# Patient Record
Sex: Male | Born: 2005 | Race: White | Hispanic: No | Marital: Single
Health system: Southern US, Community
[De-identification: ages and names within clinical notes are randomized; demographics above are authoritative.]

## PROBLEM LIST (undated history)

## (undated) DIAGNOSIS — E669 Obesity, unspecified: Secondary | ICD-10-CM

## (undated) DIAGNOSIS — J302 Other seasonal allergic rhinitis: Secondary | ICD-10-CM

## (undated) DIAGNOSIS — E66811 Obesity, class 1: Secondary | ICD-10-CM

## (undated) HISTORY — DX: Other seasonal allergic rhinitis: J30.2

## (undated) HISTORY — PX: MANDIBLE SURGERY: SHX707

## (undated) HISTORY — DX: Obesity, class 1: E66.811

## (undated) HISTORY — DX: Obesity, unspecified: E66.9

---
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
## 2023-02-03 ENCOUNTER — Ambulatory Visit (INDEPENDENT_AMBULATORY_CARE_PROVIDER_SITE_OTHER): Payer: Medicaid Other | Admitting: Licensed Clinical Social Worker

## 2023-02-03 DIAGNOSIS — F331 Major depressive disorder, recurrent, moderate: Secondary | ICD-10-CM

## 2023-02-03 NOTE — Progress Notes (Addendum)
 o  THERAPIST PROGRESS NOTE  Session Time: 1:00 pm-1:45 pm  Type of Therapy: Individual Therapy  Purpose of session: Joseph [name] will manage mood and anxiety as evidenced by returning to a previous level of social functioning, increase zest for life, challenge depressive and anxious thinking, improve sleep and express emotions appropriately for 5 out of 7 days for 60 days.  ProgressTowards Goals: Progressing  Interventions: Therapist utilized CBT and Solution focused brief therapy to address mood and anxiety. Therapist provided support and empathy to patient during session. Therapist administered a PHQ9 and GAD7 to patient during session.  Therapist worked with patient on challenging depressive thoughts related to not achieving a personal goal.     Effectiveness:  Patient was oriented x4 (person, place, situation, and time). Patient was casually dressed, and appropriately groomed. Patient was alert, engaged, pleasant, and cooperative. Patient completed a PHQ9 and GAD7. Patient enjoyed the holidays and his time off of school. Patient was able to purchase a PC for himself from working. Patient was sick and then started work around Thanksgiving. He quickly realized that it was too much for him and he was overhwhelmed. Patient quit to avoid impacting his physical and mental health. Patient has been beating himself up due to feeling like he failed. Patient understood that he was able to achieve one of his goals of buying a computer though working. He didn't want to sacrifice his main goal of completing school to achieve another one. Patient is planning on trying to work again for the same company in spring or summer so that he can get used to it before the next holiday season.    Patient engaged in session. He responded well to interventions. Patient continues to meet criteria for Major depressive disorder, recurrent episode, moderate with anxious distress. Patient will continue in outpatient therapy due to  being the least restrictive service to meet his needs. Patient made moderate progress on his goals.     12/06/2022    2:07 PM 11/23/2022    1:10 PM 11/11/2022    2:06 PM  Depression screen PHQ 2/9  Decreased Interest 2 2 2   Down, Depressed, Hopeless 2 2 2   PHQ - 2 Score 4 4 4   Altered sleeping 2 1 2   Tired, decreased energy 2 2 2   Change in appetite 0 0 0  Feeling bad or failure about yourself  1 1 1   Trouble concentrating 2 2 1   Moving slowly or fidgety/restless 0 0 0  Suicidal thoughts 1 1 2   PHQ-9 Score 12 11 12        12/06/2022    2:08 PM 11/23/2022    1:10 PM 11/11/2022    2:06 PM  GAD 7 : Generalized Anxiety Score  Nervous, Anxious, on Edge 2 1 1   Control/stop worrying 1 1 0  Worry too much - different things 2 1 1   Trouble relaxing 2 2 2   Restless 2 1 1   Easily annoyed or irritable 2 2 2   Afraid - awful might happen 0 0 0  Total GAD 7 Score 11 8 7   Anxiety Difficulty Somewhat difficult Somewhat difficult Somewhat difficult     Suicidal/Homicidal: Nowithout intent/plan  Plan: Return again in 2-4 weeks.   Diagnosis: Major depressive disorder, recurrent episode, moderate with anxious distress (HCC)  Collaboration of Care: Other sources will be identified.   Patient/Guardian was advised Release of Information must be obtained prior to any record release in order to collaborate their care with an outside provider. Patient/Guardian was  advised if they have not already done so to contact the registration department to sign all necessary forms in order for us  to release information regarding their care.   Consent: Patient/Guardian gives verbal consent for treatment and assignment of benefits for services provided during this visit. Patient/Guardian expressed understanding and agreed to proceed.   Fonda Conroy, LCSW 02/03/2023

## 2023-02-15 ENCOUNTER — Ambulatory Visit (INDEPENDENT_AMBULATORY_CARE_PROVIDER_SITE_OTHER): Payer: Medicaid Other | Admitting: Licensed Clinical Social Worker

## 2023-02-15 DIAGNOSIS — F331 Major depressive disorder, recurrent, moderate: Secondary | ICD-10-CM

## 2023-02-16 NOTE — Progress Notes (Signed)
Virtual Visit via Video Note  I connected with Joseph Joseph [name] on 02/16/23 at  1:00 PM EST by a video enabled telemedicine application and verified that I am speaking with the correct person using two identifiers.  Location: Joseph Joseph [name]: Home Provider: Office   I discussed the limitations of evaluation and management by telemedicine and the availability of in person appointments. The Joseph Joseph [name] expressed understanding and agreed to proceed.    I discussed the assessment and treatment plan with the Joseph Joseph [name]. The Joseph Joseph [name] was provided an opportunity to ask questions and all were answered. The Joseph Joseph [name] agreed with the plan and demonstrated an understanding of the instructions.   The Joseph Joseph [name] was advised to call back or seek an in-person evaluation if the symptoms worsen or if the condition fails to improve as anticipated.  I provided 39 minutes of non-face-to-face time during this encounter.   Bynum Bellows, LCSW   THERAPIST PROGRESS NOTE  Session Time: 1:00 pm-1:39 pm  Type of Therapy: Individual Therapy  Purpose of session: Joseph Joseph [name] manage mood and anxiety as evidenced by returning to a previous level of social functioning, increase zest for life, challenge depressive and anxious thinking, improve sleep and express emotions appropriately for 5 out of 7 days for 60 days.  ProgressTowards Goals: Progressing  Interventions: Therapist utilized CBT and Solution focused brief therapy to address mood and anxiety. Therapist provided support and empathy to Joseph Joseph [name] during session. Therapist processed Joseph Joseph [name]'s feelings to identify triggers for mood. Therapist worked with Joseph Joseph [name] to identify thoughts and feelings related to his identify and returning to a previous level of functioning.    Effectiveness:  Joseph Joseph [name] was oriented x4 (person, place, situation, and time). Joseph Joseph [name] was casually dressed, and appropriately groomed. Joseph Joseph [name] was alert, engaged, pleasant, and cooperative. Joseph Joseph [name] noted that things have  been up and down. Joseph Joseph [name] noted that he Joseph [name] be starting work soon. He noted that he has had some passing thoughts and some thoughts that have stuck with him that impact his mood. Joseph Joseph [name] has thought about his identify and feels that he is transgender. He has been changing his voice online to present feminine. He is not sure how he wants to present or transition. Joseph Joseph [name] is worried about coming out to his parents. He feels like his mother would be accepting but his parents have made "off handed comments" about LGBTQ community. Joseph Joseph [name] is focusing on finishing school and then starting to work. Joseph Joseph [name] is going to look into taking hormones, etc at a later date but isn't sure what insurance would cover. Joseph Joseph [name] is going to take small steps to transition.    Joseph Joseph [name] engaged in session. He responded well to interventions. Joseph Joseph [name] continues to meet criteria for Major depressive disorder, recurrent episode, moderate with anxious distress. Joseph Joseph [name] Joseph [name] continue in outpatient therapy due to being the least restrictive service to meet his needs. Joseph Joseph [name] made moderate progress on his goals.     02/03/2023    1:15 PM 12/06/2022    2:07 PM 11/23/2022    1:10 PM  Depression screen PHQ 2/9  Decreased Interest 2 2 2   Down, Depressed, Hopeless 2 2 2   PHQ - 2 Score 4 4 4   Altered sleeping 2 2 1   Tired, decreased energy 2 2 2   Change in appetite 0 0 0  Feeling bad or failure about yourself  2 1 1   Trouble concentrating 1 2 2   Moving slowly or fidgety/restless 0 0 0  Suicidal thoughts 1 1 1   PHQ-9 Score 12 12 11  02/03/2023    1:15 PM 12/06/2022    2:08 PM 11/23/2022    1:10 PM 11/11/2022    2:06 PM  GAD 7 : Generalized Anxiety Score  Nervous, Anxious, on Edge 2 2 1 1   Control/stop worrying 1 1 1  0  Worry too much - different things 2 2 1 1   Trouble relaxing 2 2 2 2   Restless 1 2 1 1   Easily annoyed or irritable 2 2 2 2   Afraid - awful might happen 1 0 0 0  Total GAD 7 Score 11 11 8 7   Anxiety  Difficulty Somewhat difficult Somewhat difficult Somewhat difficult Somewhat difficult     Suicidal/Homicidal: Nowithout intent/plan  Plan: Return again in 2-4 weeks.   Diagnosis: Major depressive disorder, recurrent episode, moderate with anxious distress (HCC)  Collaboration of Care: Other sources Joseph [name] be identified.   Joseph Joseph [name]/Guardian was advised Release of Information must be obtained prior to any record release in order to collaborate their care with an outside provider. Joseph Joseph [name]/Guardian was advised if they have not already done so to contact the registration department to sign all necessary forms in order for [place] to release information regarding their care.   Consent: Joseph Joseph [name]/Guardian gives verbal consent for treatment and assignment of benefits for services provided during this visit. Joseph Joseph [name]/Guardian expressed understanding and agreed to proceed.   Bynum Bellows, LCSW 02/16/2023

## 2023-02-28 ENCOUNTER — Ambulatory Visit (INDEPENDENT_AMBULATORY_CARE_PROVIDER_SITE_OTHER): Payer: Medicaid Other | Admitting: Licensed Clinical Social Worker

## 2023-02-28 DIAGNOSIS — F331 Major depressive disorder, recurrent, moderate: Secondary | ICD-10-CM

## 2023-02-28 NOTE — Progress Notes (Unsigned)
Virtual Visit via Video Note  I connected with Nizar Decuir on 02/28/23 at  1:00 PM EST by a video enabled telemedicine application and verified that I am speaking with the correct person using two identifiers.  Location: Patient: Home Provider: Office   I discussed the limitations of evaluation and management by telemedicine and the availability of in person appointments. The patient expressed understanding and agreed to proceed.    I discussed the assessment and treatment plan with the patient. The patient was provided an opportunity to ask questions and all were answered. The patient agreed with the plan and demonstrated an understanding of the instructions.   The patient was advised to call back or seek an in-person evaluation if the symptoms worsen or if the condition fails to improve as anticipated.  I provided 39 minutes of non-face-to-face time during this encounter.   Bynum Bellows, LCSW   THERAPIST PROGRESS NOTE  Session Time: 1:00 pm-1:39 pm  Type of Therapy: Individual Therapy  Purpose of session: Zakye will manage mood and anxiety as evidenced by returning to a previous level of social functioning, increase zest for life, challenge depressive and anxious thinking, improve sleep and express emotions appropriately for 5 out of 7 days for 60 days.  ProgressTowards Goals: Progressing  Interventions: Therapist utilized CBT and Solution focused brief therapy to address mood and anxiety. Therapist provided support and empathy to patient during session. Therapist administered PHQ9 and GAD7 to patient.    Effectiveness:  Patient was oriented x4 (person, place, situation, and time). Patient was casually dressed, and appropriately groomed. Patient was alert, engaged, pleasant, and cooperative.   Patient engaged in session. He responded well to interventions. Patient continues to meet criteria for Major depressive disorder, recurrent episode, moderate with anxious distress. Patient  will continue in outpatient therapy due to being the least restrictive service to meet his needs. Patient made moderate progress on his goals.     02/28/2023    1:07 PM 02/03/2023    1:15 PM 12/06/2022    2:07 PM  Depression screen PHQ 2/9  Decreased Interest 2 2 2   Down, Depressed, Hopeless 2 2 2   PHQ - 2 Score 4 4 4   Altered sleeping 2 2 2   Tired, decreased energy 2 2 2   Change in appetite 1 0 0  Feeling bad or failure about yourself  2 2 1   Trouble concentrating 1 1 2   Moving slowly or fidgety/restless 0 0 0  Suicidal thoughts 1 1 1   PHQ-9 Score 13 12 12        02/28/2023    1:08 PM 02/03/2023    1:15 PM 12/06/2022    2:08 PM 11/23/2022    1:10 PM  GAD 7 : Generalized Anxiety Score  Nervous, Anxious, on Edge 2 2 2 1   Control/stop worrying 2 1 1 1   Worry too much - different things 2 2 2 1   Trouble relaxing 2 2 2 2   Restless 1 1 2 1   Easily annoyed or irritable 0 2 2 2   Afraid - awful might happen 1 1 0 0  Total GAD 7 Score 10 11 11 8   Anxiety Difficulty Somewhat difficult Somewhat difficult Somewhat difficult Somewhat difficult     Suicidal/Homicidal: Nowithout intent/plan  Plan: Return again in 2-4 weeks.   Diagnosis: Major depressive disorder, recurrent episode, moderate with anxious distress (HCC)  Collaboration of Care: Other sources will be identified.   Patient/Guardian was advised Release of Information must be obtained prior to any record  release in order to collaborate their care with an outside provider. Patient/Guardian was advised if they have not already done so to contact the registration department to sign all necessary forms in order for [place] to release information regarding their care.   Consent: Patient/Guardian gives verbal consent for treatment and assignment of benefits for services provided during this visit. Patient/Guardian expressed understanding and agreed to proceed.   Bynum Bellows, LCSW 02/28/2023

## 2023-03-14 ENCOUNTER — Ambulatory Visit (INDEPENDENT_AMBULATORY_CARE_PROVIDER_SITE_OTHER): Payer: Medicaid Other | Admitting: Licensed Clinical Social Worker

## 2023-03-14 DIAGNOSIS — F331 Major depressive disorder, recurrent, moderate: Secondary | ICD-10-CM

## 2023-03-14 NOTE — Progress Notes (Signed)
THERAPIST PROGRESS NOTE  Session Time: 1:05 pm-1:45 pm  Type of Therapy: Individual Therapy  Purpose of session: Joseph [name] will manage mood and anxiety as evidenced by returning to a previous level of social functioning, increase zest for life, challenge depressive and anxious thinking, improve sleep and express emotions appropriately for 5 out of 7 days for 60 days.  ProgressTowards Goals: Progressing  Interventions: Therapist utilized CBT and Solution focused brief therapy to address mood and anxiety. Therapist provided support and empathy to patient during session. Therapist administered PHQ9 and GAD7 to patient. Therapist processed patient's feelings to identify triggers for mood. Therapist assessed patient's passive SI. Therapist worked with patient on returning to a previous level of functioning and gender identity.   Effectiveness:  Patient was oriented x4 (person, place, situation, and time). Patient was casually dressed, and appropriately groomed. Patient was alert, engaged, pleasant, and cooperative. Patient completed the PHQ9 and GAD7.  Patient noted that things have been good. He feels like not a lot of "major" things have occurred. Patient has had passive SI. He denies plans or means. Patient has no desire to die but has pondered what it would be like if he were not around. He wonders if anyone would grieve him. Patient noted this is his brain being mean to him. He does not give into those thoughts and has parents as well as friends that he speaks to if he is feeling down.   Patient has been looking into taking HRT and the process of transitioning. Patient feels good thinking about taking HRT eventually. Patient feels like he would be more comfortable with another gender. Patient is still researching it. Patient also asked to get connected to a psychiatrist.    Patient engaged in session. He responded well to interventions. Patient continues to meet criteria for Major depressive disorder,  recurrent episode, moderate with anxious distress. Patient will continue in outpatient therapy due to being the least restrictive service to meet his needs. Patient made moderate progress on his goals.     03/14/2023    1:07 PM 02/28/2023    1:07 PM 02/03/2023    1:15 PM  Depression screen PHQ 2/9  Decreased Interest 2 2 2   Down, Depressed, Hopeless 1 2 2   PHQ - 2 Score 3 4 4   Altered sleeping 2 2 2   Tired, decreased energy 1 2 2   Change in appetite 1 1 0  Feeling bad or failure about yourself  2 2 2   Trouble concentrating 2 1 1   Moving slowly or fidgety/restless 1 0 0  Suicidal thoughts 2 1 1   PHQ-9 Score 14 13 12        03/14/2023    1:08 PM 02/28/2023    1:08 PM 02/03/2023    1:15 PM 12/06/2022    2:08 PM  GAD 7 : Generalized Anxiety Score  Nervous, Anxious, on Edge 1 2 2 2   Control/stop worrying 1 2 1 1   Worry too much - different things 1 2 2 2   Trouble relaxing 2 2 2 2   Restless 2 1 1 2   Easily annoyed or irritable 1 0 2 2  Afraid - awful might happen 1 1 1  0  Total GAD 7 Score 9 10 11 11   Anxiety Difficulty Somewhat difficult Somewhat difficult Somewhat difficult Somewhat difficult     Suicidal/Homicidal: Nowithout intent/plan  Plan: Return again in 2-4 weeks.   Diagnosis: Major depressive disorder, recurrent episode, moderate with anxious distress (HCC)  Collaboration of Care: Other sources will be  identified.   Patient/Guardian was advised Release of Information must be obtained prior to any record release in order to collaborate their care with an outside provider. Patient/Guardian was advised if they have not already done so to contact the registration department to sign all necessary forms in order for [place] to release information regarding their care.   Consent: Patient/Guardian gives verbal consent for treatment and assignment of benefits for services provided during this visit. Patient/Guardian expressed understanding and agreed to proceed.   Bynum Bellows,  LCSW 03/14/2023

## 2023-03-17 ENCOUNTER — Encounter (HOSPITAL_COMMUNITY): Payer: Self-pay | Admitting: Psychiatry

## 2023-03-17 ENCOUNTER — Ambulatory Visit (HOSPITAL_COMMUNITY): Payer: Medicaid Other | Admitting: Psychiatry

## 2023-03-17 DIAGNOSIS — F411 Generalized anxiety disorder: Secondary | ICD-10-CM

## 2023-03-17 DIAGNOSIS — F4010 Social phobia, unspecified: Secondary | ICD-10-CM | POA: Diagnosis not present

## 2023-03-17 DIAGNOSIS — F331 Major depressive disorder, recurrent, moderate: Secondary | ICD-10-CM | POA: Diagnosis not present

## 2023-03-17 MED ORDER — CITALOPRAM HYDROBROMIDE 10 MG PO TABS: 10.0000 mg | ORAL_TABLET | Freq: Every day | ORAL | 0 refills | Status: DC

## 2023-03-17 NOTE — Progress Notes (Signed)
Psychiatric Initial Adult Assessment   Patient Identification: Joseph [name] MRN:  [id number] Date of Evaluation:  03/17/2023 Referral Source: Therapist Chief Complaint:   Chief Complaint  Patient presents with   Establish Care   Depression   Visit Diagnosis:    ICD-10-CM   1. Major depressive disorder, recurrent episode, moderate with anxious distress (HCC)  F33.1     2. GAD (generalized anxiety disorder)  F41.1     3. Social anxiety disorder  F40.10      Virtual Visit via Video Note  I connected with Qusay Radford on 03/17/23 at 11:00 AM EST by a video enabled telemedicine application and verified that I am speaking with the correct person using two identifiers.  Location: Patient: home Provider: home office    I discussed the limitations of evaluation and management by telemedicine and the availability of in person appointments. The patient expressed understanding and agreed to proceed.     I discussed the assessment and treatment plan with the patient. The patient was provided an opportunity to ask questions and all were answered. The patient agreed with the plan and demonstrated an understanding of the instructions.   The patient was advised to call back or seek an in-person evaluation if the symptoms worsen or if the condition fails to improve as anticipated.  I provided 60 minutes of non-face-to-face time during this encounter.   History of Present Illness: Patient is a single 18 years old white male lives with his parents plans to complete his GED this year referred by her therapist to establish care with history of depression.    Patient presents with a history of depression episode that has started a couple of years ago his depression is described with sadness withdrawn isolation decreased energy disturbed sleep and feeling of hopelessness and passive suicidal ideation with no attempt in the past.  Once started therapy he feels his depression is somewhat better but  still present  There is no associated psychotic symptoms with that depression  There is some episode that can last for 1 or 2 days of elevated mood that he is more social and that he is somewhat more talkative otherwise he is described himself to be more withdrawn and has social anxiety worries about criticism or of meeting new people  He also describes anxiety, excessive worries he worries about his future about his goals he feels his anxiety makes him worry and how to handle or approach his stressors  He has a lot of answers described as I do not know and then he feels that he wants to explore what what he is or should be diagnosed with.  On collaboration he does understand and agreed that it is pretty clear that his depression needs to be treated as he has had some passive suicidal thoughts and no plan and is willing to start on medication.  He is close to his mom but he is not still opening up about things which does not bother him. He is stressed out about transitioning he wants to transition to male but has not opened up with family members or anybody else but accept for his close friend.  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  He does feel shy withdrawn and also endorses social anxiety more so he is good with his couple of good friends  Describes depression to be ongoing with feeling of hopelessness disturbed sleep and appetite  Aggravating factors; difficult schooling , how to tell of transitioning to family when ready  Modifying factors: video games, music , some friends  Duration more then 3 years  Hospital admission: denies  Suicide attempt: denies   Associated Signs/Symptoms: Depression Symptoms:   depressed mood, psychomotor retardation, fatigue, difficulty concentrating, anxiety, disturbed sleep, weight loss, (Hypo) Manic Symptoms:  Distractibility, Anxiety Symptoms:  Excessive Worry, Social Anxiety,  Past Psychiatric History: depression  Previous Psychotropic Medications: Yes  Prozac didn't help Substance Abuse History in the last 12 months:  No.  Consequences of Substance Abuse: NA  Past Medical History:  Past Medical History:  Diagnosis Date   Class 1 obesity    Seasonal allergies     Past Surgical History:  Procedure Laterality Date   MANDIBLE SURGERY      Family Psychiatric History: mom; bipolar, brother Maricela Curet: bipolar  Family History:  Family History  Problem Relation Age of Onset   Rheum arthritis Father    Heart attack Father    Heart disease Father    Diabetes Maternal Grandmother     Social History:   Social History   Socioeconomic History   Marital status: Single    Spouse name: Not on file   Number of children: Not on file   Years of education: Not on file   Highest education level: Not on file  Occupational History   Not on file  Tobacco Use   Smoking status: Passive Smoke Exposure - Never Smoker   Smokeless tobacco: Never  Substance and Sexual Activity   Alcohol use: No   Drug use: No   Sexual activity: Not on file  Other Topics Concern   Not on file  Social History Narrative   Not on file   Social Drivers of Health   Financial Resource Strain: Low Risk  (02/10/2023)   Received from [facility]   Overall Financial Resource Strain (CARDIA)    Difficulty of Paying Living Expenses: Not very hard  Food Insecurity: No Food Insecurity (02/10/2023)   Received [facility]   Hunger Vital Sign    Worried About Running Out of Food in the Last Year: Never true    Ran Out of Food in the Last Year: Never true  Transportation Needs: No Transportation Needs (02/10/2023)   Received from [facility] Transportation     Lack of Transportation (Medical): No    Lack of Transportation (Non-Medical): No  Physical Activity: Unknown (02/10/2023)   [facility]   Exercise Vital Sign    Days of Exercise per Week: 0 days    Minutes of Exercise per Session: Not on file  Stress: Stress Concern Present (02/10/2023)   Received from [facility] - Occupational Stress Questionnaire    Feeling of Stress : To some extent  Social Connections: Somewhat Isolated (02/10/2023)   Received from [facility]   Social Network    How would you rate your social network (family, work, friends)?: Restricted participation with some degree of social isolation    Additional Social History: grew up with parents, denies abuse but some challenges denies abuse Difficult elementary schooling and bullying then did online schooling high school  Allergies:  No Known Allergies  Metabolic Disorder  Labs: No results found for: "HGBA1C", "MPG" No results found for: "PROLACTIN" No results found for: "CHOL", "TRIG", "HDL", "CHOLHDL", "VLDL", "LDLCALC" No results found for: "TSH"  Therapeutic Level Labs: No results found for: "LITHIUM" No results found for: "CBMZ" No results found for: "VALPROATE"  Current Medications: Current Outpatient Medications  Medication Sig Dispense Refill   citalopram (CELEXA) 10 MG tablet Take 1 tablet (10 mg total) by mouth daily. 30 tablet 0   No current facility-administered medications for this visit.    Psychiatric Specialty Exam: Review of Systems  There were no vitals taken for this visit.There is no height or weight on file to calculate BMI.  General Appearance: Casual  Eye Contact:  Fair  Speech:  Clear and Coherent  Volume:  Decreased  Mood:  Depressed  Affect:  Congruent  Thought Process:  Goal Directed  Orientation:  Full (Time, Place, and Person)  Thought Content:  Rumination  Suicidal Thoughts:  No  Homicidal Thoughts:  No   Memory:  Immediate;   Fair  Judgement:  Fair  Insight:  Fair  Psychomotor Activity:  Decreased  Concentration:  Concentration: Fair  Recall:  [employer] of Knowledge:Good  Language: Good  Akathisia:  No  Handed:    AIMS (if indicated):  not done  Assets:  Desire for Improvement  ADL's:  Intact  Cognition: WNL  Sleep:   irregular   Screenings: GAD-7    Advertising copywriter from 03/14/2023 in [facility] at [facility] Counselor from 02/28/2023 in [facility] Outpatient Behavioral Health at [facility] Counselor from 02/03/2023 in [facility] Outpatient Behavior[facility] at [facility] Counselor from 12/06/2022 in [facility] Outpatient Behavior[facility] Health at [facility] Counselor from 11/23/2022 in [facility] Outpatient Behavioral [facility] at [facility]  Total GAD-7 Score 9 10 11 11 8       PHQ2-9    Flowsheet Row Office Visit from 03/17/2023 in [facility] at [facility] Counselor from 03/14/2023 in [facility] Outpatient Behavioral Health at [facility] Counselor from 02/28/2023 in [facility] at [facility] Counselor from 02/03/2023 in [facility] Health at [facility] Counselor from 12/06/2022 in [facility] Outpatient Behavioral Health at [facility]  PHQ-2 Total Score 3 3 4 4 4   PHQ-9 Total Score 10 14 13 12 12       Flowsheet Row Counselor from 06/25/2021 in [facility] Outpatient Behavioral Health at [facility]  C-SSRS RISK CATEGORY Error: Q7 should not be populated when Q6 is No       Assessment and Plan: as folllows   Major depressive disorder recurrent moderate; he is willing to start medication considering his depressive symptoms are still present therapy has helped somewhat.  Start citalopram 10 mg discussed risk and side effects we will monitor and  follow-up in 3 to 4 weeks  Discussed suicidality in detail in case has any passive harmful thoughts, that he can call understands he can call suicide hotline, urgent care visit ER and also call our office . Work on distraction and add activities to look forward . Is having challenges how to move forward with transitioning. Continue therapy to explore and help in processing  He is aware of how to get help in case he is having suicidal thoughts currently he is not suicidal never attempted does not have any plan  Generalized anxiety disorder; start Celexa 10 mg as above continue in therapy to explore his anxiety  worries and process them work on distraction from negative thoughts  Social anxiety disorder; continue working in therapy and start citalopram 10 mg as above  Patient to monitor any changes mood symptoms mood swings or and increase in elevated mood  Call us earlier otherwise follow-up in 3 to 4 weeks Collaboration of Care: Other notes and chart, reviewed of therapist   Patient/Guardian was advised Release of Information must be obtained prior to any record release in order to collaborate their care with an outside provider. Patient/Guardian was advised if they have not already done so to contact the registration department to sign all necessary forms in order for [place] to release information regarding their care.   Consent: Patient/Guardian gives verbal consent for treatment and assignment of benefits for services provided during this visit. Patient/Guardian expressed understanding and agreed to proceed.   Thresa Ross, MD 2/20/202511:18 AM

## 2023-03-30 ENCOUNTER — Ambulatory Visit (INDEPENDENT_AMBULATORY_CARE_PROVIDER_SITE_OTHER): Payer: Medicaid Other | Admitting: Licensed Clinical Social Worker

## 2023-03-30 DIAGNOSIS — F331 Major depressive disorder, recurrent, moderate: Secondary | ICD-10-CM | POA: Diagnosis not present

## 2023-03-30 DIAGNOSIS — F411 Generalized anxiety disorder: Secondary | ICD-10-CM

## 2023-03-30 NOTE — Progress Notes (Unsigned)
 THERAPIST PROGRESS NOTE  Session Time: 1:03 pm-1:50 pm  Type of Therapy: Individual Therapy  Purpose of session: Theon will manage mood and anxiety as evidenced by returning to a previous level of social functioning, increase zest for life, challenge depressive and anxious thinking, improve sleep and express emotions appropriately for 5 out of 7 days for 60 days.  ProgressTowards Goals: Progressing  Interventions: Therapist utilized CBT and Solution focused brief therapy to address mood and anxiety. Therapist provided support and empathy to patient during session. Therapist administered PHQ9 and GAD7 to patient. Therapist worked with patient on increasing zest for life.   Effectiveness:  Patient was oriented x4 (person, place, situation, and time). Patient was casually dressed, and appropriately groomed. Patient was alert, engaged, pleasant, and cooperative. Patient completed the PHQ9 and GAD7.  Patient noted he has experienced ups and downs. Patient will be starting his GED program tomorrow. He is going in for testing. Patient also spoke with [employer] through [facility] to work on his physical health. Patient is taking steps to improve himself. Patient has been studying and researching coming out as transgender. He wants to get a job so before he comes out to his family so his family doesn't feel like they have to help him with HRT or anything. Patient just wants support from his parents but not finacial support when it comes to transition.    Patient engaged in session. He responded well to interventions. Patient continues to meet criteria for Major depressive disorder, recurrent episode, moderate with anxious distress. Patient will continue in outpatient therapy due to being the least restrictive service to meet his needs. Patient made moderate progress on his goals.     03/30/2023    1:07 PM 03/17/2023   11:14 AM 03/14/2023    1:07 PM  Depression screen PHQ 2/9  Decreased Interest 1 2 2    Down, Depressed, Hopeless 2 1 1   PHQ - 2 Score 3 3 3   Altered sleeping 2 2 2   Tired, decreased energy 2 1 1   Change in appetite 1 1 1   Feeling bad or failure about yourself  2 2 2   Trouble concentrating 1 1 2   Moving slowly or fidgety/restless 0 0 1  Suicidal thoughts 1  2  PHQ-9 Score 12 10 14   Difficult doing work/chores  Somewhat difficult        03/30/2023    1:07 PM 03/14/2023    1:08 PM 02/28/2023    1:08 PM 02/03/2023    1:15 PM  GAD 7 : Generalized Anxiety Score  Nervous, Anxious, on Edge 2 1 2 2   Control/stop worrying 2 1 2 1   Worry too much - different things 1 1 2 2   Trouble relaxing 1 2 2 2   Restless 2 2 1 1   Easily annoyed or irritable 1 1 0 2  Afraid - awful might happen 1 1 1 1   Total GAD 7 Score 10 9 10 11   Anxiety Difficulty Somewhat difficult Somewhat difficult Somewhat difficult Somewhat difficult     Suicidal/Homicidal: Nowithout intent/plan  Plan: Return again in 2-4 weeks.   Diagnosis: Major depressive disorder, recurrent episode, moderate with anxious distress (HCC)  GAD (generalized anxiety disorder)  Collaboration of Care: Other sources will be identified.   Patient/Guardian was advised Release of Information must be obtained prior to any record release in order to collaborate their care with an outside provider. Patient/Guardian was advised if they have not already done so to contact the registration department to sign  all necessary forms in order for [place] to release information regarding their care.   Consent: Patient/Guardian gives verbal consent for treatment and assignment of benefits for services provided during this visit. Patient/Guardian expressed understanding and agreed to proceed.   Bynum Bellows, LCSW 03/30/2023

## 2023-04-13 ENCOUNTER — Ambulatory Visit (INDEPENDENT_AMBULATORY_CARE_PROVIDER_SITE_OTHER): Payer: Medicaid Other | Admitting: Licensed Clinical Social Worker

## 2023-04-13 ENCOUNTER — Telehealth (HOSPITAL_COMMUNITY): Payer: Self-pay | Admitting: *Deleted

## 2023-04-13 DIAGNOSIS — F331 Major depressive disorder, recurrent, moderate: Secondary | ICD-10-CM | POA: Diagnosis not present

## 2023-04-13 DIAGNOSIS — F411 Generalized anxiety disorder: Secondary | ICD-10-CM

## 2023-04-13 MED ORDER — CITALOPRAM HYDROBROMIDE 10 MG PO TABS: 10.0000 mg | ORAL_TABLET | Freq: Every day | ORAL | 0 refills | Status: DC

## 2023-04-13 NOTE — Addendum Note (Signed)
 Addended by: Thresa Ross on: 04/13/2023 01:54 PM   Modules accepted: Orders

## 2023-04-13 NOTE — Progress Notes (Unsigned)
 THERAPIST PROGRESS NOTE  Session Time: 1:00 pm-1:45 pm  Type of Therapy: Individual Therapy  Purpose of session: Joseph [name] will manage mood and anxiety as evidenced by returning to a previous level of social functioning, increase zest for life, challenge depressive and anxious thinking, improve sleep and express emotions appropriately for 5 out of 7 days for 60 days.  ProgressTowards Goals: Progressing  Interventions: Therapist utilized CBT and Solution focused brief therapy to address mood and anxiety. Therapist provided support and empathy to patient during session. Therapist administered PHQ9 and GAD7 to patient. Therapist worked with patient to identify barriers to returning to a previous level of functioning.   Effectiveness:  Patient was oriented x4 (person, place, situation, and time). Patient was casually dressed, and appropriately groomed. Patient was alert, engaged, pleasant, and cooperative. Patient completed the PHQ9 and GAD7.  Patient noted that has felt more down. Patient feels like he "forced" his friend to play a game on two occasions that he wasn't interested in. Patient understood that his friend wouldn't have played if he didn't want to connect with him on either occasion. Patient feels like he overanalyzed the interactions. Patient feels like the medication he is on is not working like it should. He can fall asleep faster but is more emotional than he was before taking it. He noted that even his mother noticed a difference in him since he started  his medication. Patient is going to talk to the psychiatrist about this next visit. Patient has not started school. He went to testing and orientation for school but has not heard anything from the school about a start date. Patient is going to have his mother call to follow up.    Patient engaged in session. He responded well to interventions. Patient continues to meet criteria for Major depressive disorder, recurrent episode, moderate  with anxious distress. Patient will continue in outpatient therapy due to being the least restrictive service to meet his needs. Patient made moderate progress on his goals.     04/13/2023    1:02 PM 03/30/2023    1:07 PM 03/17/2023   11:14 AM  Depression screen PHQ 2/9  Decreased Interest 2 1 2   Down, Depressed, Hopeless 2 2 1   PHQ - 2 Score 4 3 3   Altered sleeping 2 2 2   Tired, decreased energy 1 2 1   Change in appetite 0 1 1  Feeling bad or failure about yourself  2 2 2   Trouble concentrating 1 1 1   Moving slowly or fidgety/restless 0 0 0  Suicidal thoughts 1 1   PHQ-9 Score 11 12 10   Difficult doing work/chores   Somewhat difficult       03/30/2023    1:07 PM 03/14/2023    1:08 PM 02/28/2023    1:08 PM 02/03/2023    1:15 PM  GAD 7 : Generalized Anxiety Score  Nervous, Anxious, on Edge 2 1 2 2   Control/stop worrying 2 1 2 1   Worry too much - different things 1 1 2 2   Trouble relaxing 1 2 2 2   Restless 2 2 1 1   Easily annoyed or irritable 1 1 0 2  Afraid - awful might happen 1 1 1 1   Total GAD 7 Score 10 9 10 11   Anxiety Difficulty Somewhat difficult Somewhat difficult Somewhat difficult Somewhat difficult     Suicidal/Homicidal: Nowithout intent/plan  Plan: Return again in 2-4 weeks.   Diagnosis: Major depressive disorder, recurrent episode, moderate with anxious distress (HCC)  GAD (generalized  anxiety disorder)  Collaboration of Care: Other sources will be identified.   Patient/Guardian was advised Release of Information must be obtained prior to any record release in order to collaborate their care with an outside provider. Patient/Guardian was advised if they have not already done so to contact the registration department to sign all necessary forms in order for [place] to release information regarding their care.   Consent: Patient/Guardian gives verbal consent for treatment and assignment of benefits for services provided during this visit. Patient/Guardian expressed  understanding and agreed to proceed.   Bynum Bellows, LCSW 04/13/2023

## 2023-04-13 NOTE — Telephone Encounter (Signed)
 90 DAY PRESCRIPTION REQUEST [id number] - , [place] - [address]   citalopram (CELEXA) 10 MG tablet   NEXT APPT 04/20/23 LAST APPT 03/17/23

## 2023-04-18 ENCOUNTER — Telehealth (HOSPITAL_COMMUNITY): Payer: Self-pay | Admitting: *Deleted

## 2023-04-18 NOTE — Telephone Encounter (Signed)
 90 day Rx Request [id number] - [place], [place] - [address]   citalopram (CELEXA) 10 MG tablet  NEXT APPT 04/20/23 LAST APPT 03/17/23

## 2023-04-18 NOTE — Telephone Encounter (Signed)
 LVM --F/U PER PROVIDER Appointment in 2 days, can patient wait.

## 2023-04-20 ENCOUNTER — Telehealth (HOSPITAL_COMMUNITY): Payer: Medicaid Other | Admitting: Psychiatry

## 2023-04-20 ENCOUNTER — Encounter (HOSPITAL_COMMUNITY): Payer: Self-pay | Admitting: Psychiatry

## 2023-04-20 DIAGNOSIS — F331 Major depressive disorder, recurrent, moderate: Secondary | ICD-10-CM | POA: Diagnosis not present

## 2023-04-20 DIAGNOSIS — F411 Generalized anxiety disorder: Secondary | ICD-10-CM

## 2023-04-20 DIAGNOSIS — F4010 Social phobia, unspecified: Secondary | ICD-10-CM

## 2023-04-20 MED ORDER — VENLAFAXINE HCL ER 37.5 MG PO CP24: 37.5000 mg | ORAL_CAPSULE | Freq: Every day | ORAL | 0 refills | Status: DC

## 2023-04-20 NOTE — Progress Notes (Signed)
 [facility] Follow up visit  Patient Identification: Joseph [name] MRN:  [id number] Date of Evaluation:  04/20/2023 Referral Source: Therapist Chief Complaint:   No chief complaint on file. Follow up mood /depression  Visit Diagnosis:    ICD-10-CM   1. Major depressive disorder, recurrent episode, moderate with anxious distress (HCC)  F33.1     2. GAD (generalized anxiety disorder)  F41.1     3. Social anxiety disorder  F40.10     Virtual Visit via Video Note  I connected with Joseph [name] on 04/20/23 at 10:00 AM EDT by a video enabled telemedicine application and verified that I am speaking with the correct person using two identifiers.  Location: Patient: home Provider: home office   I discussed the limitations of evaluation and management by telemedicine and the availability of in person appointments. The patient expressed understanding and agreed to proceed.     I discussed the assessment and treatment plan with the patient. The patient was provided an opportunity to ask questions and all were answered. The patient agreed with the plan and demonstrated an understanding of the instructions.   The patient was advised to call back or seek an in-person evaluation if the symptoms worsen or if the condition fails to improve as anticipated.  I provided 20 minutes of non-face-to-face time during this encounter.     History of Present Illness: Patient is a single 18 years old white male lives with his parents plans to complete his GED this year initially  referred by her therapist to establish care with history of depression.    Patient diagnosed with depression, anxiety, social anxiety Was having difficult time in with communicating to parents of his transitioning to male is in therapy  Last visit started celexa, he feels he has been more emotional on that and it didn't help depression.  Planning to complete GED   He was bullied when he was in elementary school but then after that  he has been doing online schooling high school was online.  Aggravating factors; difficult schooling , how to tell of transitioning to family when ready  Modifying factors: video games, music , some friends  Duration more then 3 years  Hospital admission: denies  Suicide attempt: denies  Past Psychiatric History: depression  Previous Psychotropic Medications: Yes  Prozac didn't help Substance Abuse History in the last 12 months:  No.  Consequences of Substance Abuse: NA  Past Medical History:  Past Medical History:  Diagnosis Date   Class 1 obesity    Seasonal allergies     Past Surgical History:  Procedure Laterality Date   MANDIBLE SURGERY      Family Psychiatric History: mom; bipolar, brother Maricela Curet: bipolar  Family History:  Family History  Problem Relation Age of Onset   Rheum arthritis Father    Heart attack Father    Heart disease Father    Diabetes Maternal Grandmother     Social History:   Social History   Socioeconomic History   Marital status: Single    Spouse name: Not on file   Number of children: Not on file   Years of education: Not on file   Highest education level: Not on file  Occupational History   Not on file  Tobacco Use   Smoking status: Passive Smoke Exposure - Never Smoker   Smokeless tobacco: Never  Substance and Sexual Activity   Alcohol use: No   Drug use: No   Sexual activity: Not on file  Other Topics  Concern   Not on file  Social History Narrative   Not on file   Social Drivers of Health   Financial Resource Strain: Low Risk  (02/10/2023)   Received from [facility]   Overall Financial Resource Strain (CARDIA)    Difficulty of Paying Living Expenses: Not very hard  Food Insecurity: No Food Insecurity (02/10/2023)   Received from [facility]   Hunger Vital Sign    Worried About Running Out of Food in the Last Year: Never true    Ran Out of Food in the Last Year: Never true  Transportation Needs: No  Transportation Needs (02/10/2023)   Received from [facility] - Transportation    Lack of Transportation (Medical): No    Lack of Transportation (Non-Medical): No  Physical Activity: Unknown (02/10/2023)   Received from [facility]   Exercise Vital Sign    Days of Exercise per Week: 0 days    Minutes of Exercise per Session: Not on file  Stress: Stress Concern Present (02/10/2023)   Received from [facility] - Occupational Stress Questionnaire    Feeling of Stress : To some extent  Social Connections: Somewhat Isolated (02/10/2023)   Received from [facility]   Social Network    How would you rate your social network (family, work, friends)?: Restricted participation with some degree of social isolation     Allergies:  No Known Allergies  Metabolic Disorder Labs: No results found for: "HGBA1C", "MPG" No results found for: "PROLACTIN" No results found for: "CHOL", "TRIG", "HDL", "CHOLHDL", "VLDL", "LDLCALC" No results found for: "TSH"  Therapeutic Level Labs: No results found for: "LITHIUM" No results found for: "CBMZ" No results found for: "VALPROATE"  Current Medications: Current Outpatient Medications  Medication Sig Dispense Refill   venlafaxine XR (EFFEXOR XR) 37.5 MG 24 hr capsule Take 1 capsule (37.5 mg total) by mouth daily with breakfast. 30 capsule 0   No current facility-administered medications for this visit.    Psychiatric Specialty Exam: Review of Systems  There were no vitals taken for this visit.There is no height or weight on file to calculate BMI.  General Appearance: Casual  Eye Contact:  Fair  Speech:  Clear and Coherent  Volume:  Decreased  Mood:  subdued  Affect:  Congruent  Thought Process:  Goal Directed  Orientation:  Full (Time, Place, and Person)  Thought Content:  Rumination  Suicidal Thoughts:  No  Homicidal Thoughts:  No  Memory:  Immediate;   Fair  Judgement:  Fair   Insight:  Fair  Psychomotor Activity:  Decreased  Concentration:  Concentration: Fair  Recall:  Fair  Fund of Knowledge:Good  Language: Good  Akathisia:  No  Handed:    AIMS (if indicated):  not done  Assets:  Desire for Improvement  ADL's:  Intact  Cognition: WNL  Sleep:   irregular   Screenings: GAD-7    Flowsheet Row Counselor from 04/13/2023 in [facility] Out[facility] Behavior[facility] at [facility] Counselor from 03/30/2023 in [facility] Outpatient Behavioral Health at [facility] Counselor from 03/14/2023 in [facility] Outpatient Behavioral Health at [facility] Counselor from 02/28/2023 in [facility] Outpatient Behavioral Health at [facility] Counselor from 02/03/2023 in [facility] Health Outpatient Behavioral Health at [facility]  Total GAD-7 Score 8 10 9 10 11       PHQ2-9    Flowsheet Row Counselor from 04/13/2023 in [facility] Outpatient Behavioral Health at [facility]  Counselor from 03/30/2023 in [facility] Outpatient Behavioral Health at [facility] Office Visit from 03/17/2023 in [facility] Outpatient Behavioral Health at [facility] Counselor from 03/14/2023 in [facility] Outpatient Behavioral Health at [facility] Counselor from 02/28/2023 in [facility] Outpatient Behavior[facility] Health at [facility]  PHQ-2 Total Score 4 3 3 3 4   PHQ-9 Total Score 11 12 10 14 13       Flowsheet Row Counselor from 06/25/2021 in [facility] Outpatient Behavioral Health at [facility]  C-SSRS RISK CATEGORY Error: Q7 should not be populated when Q6 is No       Assessment and Plan: as folllows  Prior documentation reviewed   Major depressive disorder recurrent moderate; subdued, will stop celexa, start effexor small dose continue therapy    Generalized anxiety disorder; start effexor   Social anxiety disorder; continue therapy and start effexor  Add ME time   Patient to  continue monitor any changes mood symptoms mood swings or and increase in elevated mood  Call us earlier otherwise follow-up in 4 weeks  Collaboration of Care: Other notes and chart, reviewed of therapist   Patient/Guardian was advised Release of Information must be obtained prior to any record release in order to collaborate their care with an outside provider. Patient/Guardian was advised if they have not already done so to contact the registration department to sign all necessary forms in order for [place] to release information regarding their care.   Consent: Patient/Guardian gives verbal consent for treatment and assignment of benefits for services provided during this visit. Patient/Guardian expressed understanding and agreed to proceed.   Thresa Ross, MD 3/26/202510:08 AM

## 2023-04-26 ENCOUNTER — Ambulatory Visit (INDEPENDENT_AMBULATORY_CARE_PROVIDER_SITE_OTHER): Payer: Medicaid Other | Admitting: Licensed Clinical Social Worker

## 2023-04-26 DIAGNOSIS — F331 Major depressive disorder, recurrent, moderate: Secondary | ICD-10-CM | POA: Diagnosis not present

## 2023-04-26 DIAGNOSIS — F411 Generalized anxiety disorder: Secondary | ICD-10-CM

## 2023-04-26 NOTE — Progress Notes (Signed)
 THERAPIST PROGRESS NOTE  Session Time: 1:00 pm-1:45 pm  Type of Therapy: Individual Therapy  Purpose of session: Rohin will manage mood and anxiety as evidenced by returning to a previous level of social functioning, increase zest for life, challenge depressive and anxious thinking, improve sleep and express emotions appropriately for 5 out of 7 days for 60 days.  ProgressTowards Goals: Progressing  Interventions: Therapist utilized CBT and Solution focused brief therapy to address mood and anxiety. Therapist provided support and empathy to patient during session. Therapist administered PHQ9 and GAD7 to patient. Therapist worked with patient on increasing zest for life and challenge depressive thinking.   Effectiveness:  Patient was oriented x4 (person, place, situation, and time). Patient was casually dressed, and appropriately groomed. Patient was alert, engaged, pleasant, and cooperative. Patient completed the PHQ9 and GAD7.  Patient noted that he has felt ok since last session. Most of his days have been neutral and he has had some times where he felt down. Patient got out of the home and went to [facility] with his brother and girlfriend. Patient got some Pokemon cards which he was excited about. Patient has been communicating with friends. He continues to overthink at times about his interactions with others. Patient got his school schedule figured out. The school had not issued him a schedule for some reason but it is cleared up now. Patient has to complete 10-12 hours of school a week. Right now he is just doing when he feels and doesn't have a set schedule. Patient may change this if needed.    Patient engaged in session. He responded well to interventions. Patient continues to meet criteria for Major depressive disorder, recurrent episode, moderate with anxious distress. Patient will continue in outpatient therapy due to being the least restrictive service to meet his needs. Patient  made moderate progress on his goals.     04/26/2023    1:10 PM 04/13/2023    1:02 PM 03/30/2023    1:07 PM  Depression screen PHQ 2/9  Decreased Interest 2 2 1   Down, Depressed, Hopeless 2 2 2   PHQ - 2 Score 4 4 3   Altered sleeping 2 2 2   Tired, decreased energy 2 1 2   Change in appetite 1 0 1  Feeling bad or failure about yourself  1 2 2   Trouble concentrating 0 1 1  Moving slowly or fidgety/restless 0 0 0  Suicidal thoughts 1 1 1   PHQ-9 Score 11 11 12        04/26/2023    1:11 PM 04/13/2023    1:03 PM 03/30/2023    1:07 PM 03/14/2023    1:08 PM  GAD 7 : Generalized Anxiety Score  Nervous, Anxious, on Edge 2 1 2 1   Control/stop worrying 1 1 2 1   Worry too much - different things 2 2 1 1   Trouble relaxing 2 1 1 2   Restless 1 1 2 2   Easily annoyed or irritable 1 1 1 1   Afraid - awful might happen 0 1 1 1   Total GAD 7 Score 9 8 10 9   Anxiety Difficulty Somewhat difficult Somewhat difficult Somewhat difficult Somewhat difficult     Suicidal/Homicidal: Nowithout intent/plan  Plan: Return again in 2-4 weeks.   Diagnosis: Major depressive disorder, recurrent episode, moderate with anxious distress (HCC)  GAD (generalized anxiety disorder)  Collaboration of Care: Other sources will be identified.   Patient/Guardian was advised Release of Information must be obtained prior to any record release in order to  collaborate their care with an outside provider. Patient/Guardian was advised if they have not already done so to contact the registration department to sign all necessary forms in order for [place] to release information regarding their care.   Consent: Patient/Guardian gives verbal consent for treatment and assignment of benefits for services provided during this visit. Patient/Guardian expressed understanding and agreed to proceed.   Bynum Bellows, LCSW 04/26/2023

## 2023-04-27 ENCOUNTER — Ambulatory Visit (HOSPITAL_COMMUNITY): Payer: Medicaid Other | Admitting: Licensed Clinical Social Worker

## 2023-05-09 ENCOUNTER — Ambulatory Visit (HOSPITAL_COMMUNITY): Payer: Medicaid Other | Admitting: Licensed Clinical Social Worker

## 2023-05-09 DIAGNOSIS — F411 Generalized anxiety disorder: Secondary | ICD-10-CM

## 2023-05-09 DIAGNOSIS — F331 Major depressive disorder, recurrent, moderate: Secondary | ICD-10-CM | POA: Diagnosis not present

## 2023-05-09 NOTE — Progress Notes (Signed)
 Virtual Visit via Video Note  I connected with Joseph [name] on 05/09/23 at  1:00 PM EDT by a video enabled telemedicine application and verified that I am speaking with the correct person using two identifiers.  Location: Patient: Home Provider: Office   I discussed the limitations of evaluation and management by telemedicine and the availability of in person appointments. The patient expressed understanding and agreed to proceed.   I discussed the assessment and treatment plan with the patient. The patient was provided an opportunity to ask questions and all were answered. The patient agreed with the plan and demonstrated an understanding of the instructions.   The patient was advised to call back or seek an in-person evaluation if the symptoms worsen or if the condition fails to improve as anticipated.  I provided 38 minutes of non-face-to-face time during this encounter.   Bynum Bellows, LCSW    THERAPIST PROGRESS NOTE  Session Time: 1:00 pm-1:38 pm  Type of Therapy: Individual Therapy  Purpose of session: Joseph [name] will manage mood and anxiety as evidenced by returning to a previous level of social functioning, increase zest for life, challenge depressive and anxious thinking, improve sleep and express emotions appropriately for 5 out of 7 days for 60 days.  ProgressTowards Goals: Progressing  Interventions: Therapist utilized CBT and Solution focused brief therapy to address mood and anxiety. Therapist provided support and empathy to patient during session. Therapist administered PHQ9 and GAD7 to patient.  Therapist worked with patient on improving social functioning, and challenge anxious thoughts.   Effectiveness:  Patient was oriented x4 (person, place, situation, and time). Patient was casually dressed, and appropriately groomed. Patient was alert, engaged, pleasant, and cooperative. Patient noted that he has had ups and downs. Patient noted that he got into an argument between  sessions with his father. He had accidentally woke his father up at night a few times but didn't know it. His father made sly remarks rather than addressing it directly. Patient felt like it was the more appropriately step to have a conversation with him and he would have stopped or been more mindful. Patient has been overthinking things with their friends. Patient has been turning down a friend that asks to hang out several times. It is not that he doesn't want to hang out but that it is usually when he already has plans. Patient feels like he is also to blame because he doesn't ask people to hang out. He gets anxious and feels guilty asking people to hang out because they might feel annoyed or inconvenienced.  He also feels guilty if he doesn't ask because he feels like a bad friend. Patient understood while the feeling is valid, the thought process is off. Patient is going to work on asking a friend to hang out and play video games once a week between sessions.   Patient engaged in session. He responded well to interventions. Patient continues to meet criteria for Major depressive disorder, recurrent episode, moderate with anxious distress. Patient will continue in outpatient therapy due to being the least restrictive service to meet his needs. Patient made moderate progress on his goals.     04/26/2023    1:10 PM 04/13/2023    1:02 PM 03/30/2023    1:07 PM  Depression screen PHQ 2/9  Decreased Interest 2 2 1   Down, Depressed, Hopeless 2 2 2   PHQ - 2 Score 4 4 3   Altered sleeping 2 2 2   Tired, decreased energy 2 1 2   Change  in appetite 1 0 1  Feeling bad or failure about yourself  1 2 2   Trouble concentrating 0 1 1  Moving slowly or fidgety/restless 0 0 0  Suicidal thoughts 1 1 1   PHQ-9 Score 11 11 12        04/26/2023    1:11 PM 04/13/2023    1:03 PM 03/30/2023    1:07 PM 03/14/2023    1:08 PM  GAD 7 : Generalized Anxiety Score  Nervous, Anxious, on Edge 2 1 2 1   Control/stop worrying 1 1 2 1    Worry too much - different things 2 2 1 1   Trouble relaxing 2 1 1 2   Restless 1 1 2 2   Easily annoyed or irritable 1 1 1 1   Afraid - awful might happen 0 1 1 1   Total GAD 7 Score 9 8 10 9   Anxiety Difficulty Somewhat difficult Somewhat difficult Somewhat difficult Somewhat difficult     Suicidal/Homicidal: Nowithout intent/plan  Plan: Return again in 2-4 weeks.   Diagnosis: Major depressive disorder, recurrent episode, moderate with anxious distress (HCC)  GAD (generalized anxiety disorder)  Collaboration of Care: Other sources will be identified.   Patient/Guardian was advised Release of Information must be obtained prior to any record release in order to collaborate their care with an outside provider. Patient/Guardian was advised if they have not already done so to contact the registration department to sign all necessary forms in order for us  to release information regarding their care.   Consent: Patient/Guardian gives verbal consent for treatment and assignment of benefits for services provided during this visit. Patient/Guardian expressed understanding and agreed to proceed.   Braxton Calico, LCSW 05/09/2023

## 2023-05-12 ENCOUNTER — Telehealth (HOSPITAL_COMMUNITY): Payer: Self-pay | Admitting: *Deleted

## 2023-05-12 NOTE — Telephone Encounter (Signed)
 90 day Rx Request [id number] - [place], [place] - [address] AT [address] OF [facility] [address] [[id number]]   venlafaxine XR (EFFEXOR XR) 37.5 MG 24 hr capsule   NEXT APPT 05/18/23 LAST APPT 04/20/23

## 2023-05-13 MED ORDER — VENLAFAXINE HCL ER 37.5 MG PO CP24: 37.5000 mg | ORAL_CAPSULE | Freq: Every day | ORAL | 0 refills | Status: DC

## 2023-05-13 NOTE — Addendum Note (Signed)
 Addended by: Libia Fazzini on: 05/13/2023 08:52 AM   Modules accepted: Orders

## 2023-05-18 ENCOUNTER — Encounter (HOSPITAL_COMMUNITY): Payer: Self-pay | Admitting: Psychiatry

## 2023-05-18 ENCOUNTER — Telehealth (INDEPENDENT_AMBULATORY_CARE_PROVIDER_SITE_OTHER): Admitting: Psychiatry

## 2023-05-18 DIAGNOSIS — F331 Major depressive disorder, recurrent, moderate: Secondary | ICD-10-CM | POA: Diagnosis not present

## 2023-05-18 DIAGNOSIS — F4010 Social phobia, unspecified: Secondary | ICD-10-CM

## 2023-05-18 DIAGNOSIS — F411 Generalized anxiety disorder: Secondary | ICD-10-CM

## 2023-05-18 MED ORDER — VENLAFAXINE HCL ER 75 MG PO CP24: 75.0000 mg | ORAL_CAPSULE | Freq: Every day | ORAL | Status: DC

## 2023-05-18 NOTE — Progress Notes (Signed)
 [facility] Follow up visit  Patient Identification: Joseph [name] MRN:  [id number] Date of Evaluation:  05/18/2023 Referral Source: Therapist Chief Complaint:   No chief complaint on file. Follow up mood /depression  Visit Diagnosis:    ICD-10-CM   1. Major depressive disorder, recurrent episode, moderate with anxious distress (HCC)  F33.1     2. GAD (generalized anxiety disorder)  F41.1     3. Social anxiety disorder  F40.10     Virtual Visit via Video Note  I connected with Joseph [name] on 05/18/23 at 9:25 AM by a video enabled telemedicine application and verified that I am speaking with the correct person using two identifiers.  Location: Patient: home Provider: home office   I discussed the limitations of evaluation and management by telemedicine and the availability of in person appointments. The patient expressed understanding and agreed to proceed.      I discussed the assessment and treatment plan with the patient. The patient was provided an opportunity to ask questions and all were answered. The patient agreed with the plan and demonstrated an understanding of the instructions.   The patient was advised to call back or seek an in-person evaluation if the symptoms worsen or if the condition fails to improve as anticipated.  I provided 18 minutes of non-face-to-face time during this encounter.         History of Present Illness: Patient is a single 18 years old white male lives with his parents plans to complete his GED this year initially  referred by her therapist to establish care with history of depression.    Patient diagnosed with depression, anxiety, social anxiety  Last visit changed  celexa  to effexor  some better in regard to depression. Still in therapy to explore unhappiness and worries. Difficult to express at times but overall feels effexor  has helped some Planning to complete GED   He was bullied when he was in elementary school but then after that he  has been doing online schooling high school was online.  Aggravating factors; difficult schooling , how to tell of transitioning to family when ready  Modifying factors: video games, music , some friends  Duration more then 3 years  Hospital admission: denies  Suicide attempt: denies  Past Psychiatric History: depression  Previous Psychotropic Medications: Yes  Prozac didn't help Substance Abuse History in the last 12 months:  No.  Consequences of Substance Abuse: NA  Past Medical History:  Past Medical History:  Diagnosis Date   Class 1 obesity    Seasonal allergies     Past Surgical History:  Procedure Laterality Date   MANDIBLE SURGERY      Family Psychiatric History: mom; bipolar, brother Joseph [name]: bipolar  Family History:  Family History  Problem Relation Age of Onset   Rheum arthritis Father    Heart attack Father    Heart disease Father    Diabetes Maternal Grandmother     Social History:   Social History   Socioeconomic History   Marital status: Single    Spouse name: Not on file   Number of children: Not on file   Years of education: Not on file   Highest education level: Not on file  Occupational History   Not on file  Tobacco Use   Smoking status: Passive Smoke Exposure - Never Smoker   Smokeless tobacco: Never  Substance and Sexual Activity   Alcohol use: No   Drug use: No   Sexual activity: Not on file  Other  Topics Concern   Not on file  Social History Narrative   Not on file   Social Drivers of Health   Financial Resource Strain: Low Risk  (02/10/2023)   Received from [facility]   Overall Financial Resource Strain (CARDIA)    Difficulty of Paying Living Expenses: Not very hard  Food Insecurity: No Food Insecurity (02/10/2023)   [facility]   Hunger Vital Sign    Worried About Running Out of Food in the Last Year: Never true    Ran Out of Food in the Last Year: Never true  Transportation Needs: No Transportation  Needs (02/10/2023)   [facility] from [facility] - [facility]    Lack of Transportation (Medical): No    Lack of Transportation (Non-Medical): No  Physical Activity: Unknown (02/10/2023)   Received from [facility]   Exercise Vital Sign    Days of Exercise per Week: 0 days    Minutes of Exercise per Session: Not on file  Stress: Stress Concern Present (02/10/2023)   Received from [facility] of Occupational Health - Occupational Stress Questionnaire    Feeling of Stress : To some extent  Social Connections: Somewhat Isolated (02/10/2023)   Received from [facility]   Social Network    How would you rate your social network (family, work, friends)?: Restricted participation with some degree of social isolation     Allergies:  No Known Allergies  Metabolic Disorder Labs: No results found for: "HGBA1C", "MPG" No results found for: "PROLACTIN" No results found for: "CHOL", "TRIG", "HDL", "CHOLHDL", "VLDL", "LDLCALC" No results found for: "TSH"  Therapeutic Level Labs: No results found for: "LITHIUM" No results found for: "CBMZ" No results found for: "VALPROATE"  Current Medications: Current Outpatient Medications  Medication Sig Dispense Refill   venlafaxine  XR (EFFEXOR  XR) 75 MG 24 hr capsule Take 1 capsule (75 mg total) by mouth daily with breakfast.     No current facility-administered medications for this visit.    Psychiatric Specialty Exam: Review of Systems  Cardiovascular:  Negative for chest pain.  Psychiatric/Behavioral:  Negative for agitation.     There were no vitals taken for this visit.There is no height or weight on file to calculate BMI.  General Appearance: Casual  Eye Contact:  Fair  Speech:  Clear and Coherent  Volume:  Decreased  Mood:  somewhat subdued  Affect:  Congruent  Thought Process:  Goal Directed  Orientation:  Full (Time, Place, and Person)  Thought Content:  Rumination  Suicidal Thoughts:  No   Homicidal Thoughts:  No  Memory:  Immediate;   Fair  Judgement:  Fair  Insight:  Fair  Psychomotor Activity:  Decreased  Concentration:  Concentration: Fair  Recall:  Fair  Fund of Knowledge:Good  Language: Good  Akathisia:  No  Handed:    AIMS (if indicated):  not done  Assets:  Desire for Improvement  ADL's:  Intact  Cognition: WNL  Sleep:   irregular   Screenings: GAD-7    Advertising copywriter from 04/26/2023 in [facility] at [facility] Counselor from 04/13/2023 in [facility] Out[facility] at [facility] Counselor from 03/30/2023 in [facility] Health at [facility] Counselor from 03/14/2023 in [facility] Out[facility] at [facility] Counselor from 02/28/2023 in [facility] Outpatient Behavioral Health at [facility]  Total GAD-7 Score 9 8 10 9 10       PHQ2-9  Flowsheet Row Counselor from 04/26/2023 in [facility] at [facility] Counselor from 04/13/2023 in [facility] Outpatient Behavioral Health at [facility] Counselor from 03/30/2023 in [facility] Outpatient Behavioral Health at [facility] Office Visit from 03/17/2023 in [facility] Outpatient Behavioral Health at [facility] Counselor from 03/14/2023 in [facility] Outpatient Behavioral Health at [facility]  PHQ-2 Total Score 4 4 3 3 3   PHQ-9 Total Score 11 11 12 10 14       Flowsheet Row Video Visit from 04/20/2023 in [facility] Outpatient Behavioral Health at [facility] Counselor from 06/25/2021 in [facility] Outpatient Behavioral Health at [facility]  C-SSRS RISK CATEGORY No Risk Error: Q7 should not be populated when Q6 is No       Assessment and Plan: as folllows  Prior documentation reviewed   Major depressive disorder recurrent moderate; somewhat subdued, continue therapy  to explore depression and etiology increase effexor  to 75mg ,   Generalized anxiety disorder; fluctuates, continue therapy and add activiites to bring positivity Will increase effexor    Social anxiety disorder; still working on it and continue therapy, not worse . Increase effexor   Patient to continue monitor mood and circumstances which contribute to mood changes  FU 5 weeks or earlier if needed,  Collaboration of Care: Other notes and chart, reviewed of therapist   Patient/Guardian was advised Release of Information must be obtained prior to any record release in order to collaborate their care with an outside provider. Patient/Guardian was advised if they have not already done so to contact the registration department to sign all necessary forms in order for us  to release information regarding their care.   Consent: Patient/Guardian gives verbal consent for treatment and assignment of benefits for services provided during this visit. Patient/Guardian expressed understanding and agreed to proceed.   Wray Heady, MD 4/23/20259:46 AM

## 2023-06-02 ENCOUNTER — Ambulatory Visit (INDEPENDENT_AMBULATORY_CARE_PROVIDER_SITE_OTHER): Admitting: Licensed Clinical Social Worker

## 2023-06-02 DIAGNOSIS — F331 Major depressive disorder, recurrent, moderate: Secondary | ICD-10-CM | POA: Diagnosis not present

## 2023-06-02 NOTE — Progress Notes (Signed)
 THERAPIST PROGRESS NOTE  Session Time: 1:00 pm-1:45 pm  Type of Therapy: Individual Therapy  Purpose of session: Joseph [name] will manage mood and anxiety as evidenced by returning to a previous level of social functioning, increase zest for life, challenge depressive and anxious thinking, improve sleep and express emotions appropriately for 5 out of 7 days for 60 days.  ProgressTowards Goals: Progressing  Interventions: Therapist utilized CBT and Solution focused brief therapy to address mood and anxiety. Therapist provided support and empathy to patient during session. Therapist administered PHQ9 and GAD7 to patient. Therapist worked with patient on anxious thinking.   Effectiveness:  Patient was oriented x4 (person, place, situation, and time). Patient was casually dressed, and appropriately groomed. Patient was alert, engaged, pleasant, and cooperative. Patient noted that he will probably start working again soon. 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 Patient has been thinking about donating Plasma. He found a location that is close by. Patient had about a 2 week period where he felt down and he was off of his medication. Patient got back on his medication, and feels like even on medication he will still feel down. Patient has been feeling down and wanting to isolate. Patient feels like he is too much for people. Patient can feel like he is annoying and his presence isn't wanted. Patient was bullied in [employer] school. He was even bullied by highschool kids on the bus after school. Patient was called annoying in the past. Patient can have a similar thought and feeling with his friends. Patient noted that his friends don't say anything but he starts to feel like isolating.   Patient engaged in session. He responded well to interventions. Patient continues to meet criteria for Major depressive disorder, recurrent episode, moderate with anxious distress (HCC) . Patient  will continue in outpatient therapy due to being the least restrictive service to meet his needs. Patient made moderate progress on his goals.     06/02/2023    1:09 PM 04/26/2023    1:10 PM 04/13/2023    1:02 PM  Depression screen PHQ 2/9  Decreased Interest 2 2 2   Down, Depressed, Hopeless 2 2 2   PHQ - 2 Score 4 4 4   Altered sleeping 2 2 2   Tired, decreased energy 2 2 1   Change in appetite 1 1 0  Feeling bad or failure about yourself  2 1 2   Trouble concentrating 1 0 1  Moving slowly or fidgety/restless 0 0 0  Suicidal thoughts 1 1 1   PHQ-9 Score 13 11 11        06/02/2023    1:09 PM 04/26/2023    1:11 PM 04/13/2023    1:03 PM 03/30/2023    1:07 PM  GAD 7 : Generalized Anxiety Score  Nervous, Anxious, on Edge 2 2 1 2   Control/stop worrying 1 1 1 2   Worry too much - different things 2 2 2 1   Trouble relaxing 2 2 1 1   Restless 1 1 1 2   Easily annoyed or irritable 1 1 1 1   Afraid - awful might happen 0 0 1 1  Total GAD 7 Score 9 9 8 10   Anxiety Difficulty Somewhat difficult Somewhat difficult Somewhat difficult Somewhat difficult     Suicidal/Homicidal: Nowithout intent/plan  Plan: Return again in 2-4 weeks.   Diagnosis: Major depressive disorder, recurrent episode, moderate with anxious distress (HCC)  Collaboration of Care: Other sources will be identified.   Patient/Guardian was  advised Release of Information must be obtained prior to any record release in order to collaborate their care with an outside provider. Patient/Guardian was advised if they have not already done so to contact the registration department to sign all necessary forms in order for us  to release information regarding their care.   Consent: Patient/Guardian gives verbal consent for treatment and assignment of benefits for services provided during this visit. Patient/Guardian expressed understanding and agreed to proceed.   Braxton Calico, LCSW 06/02/2023

## 2023-06-29 ENCOUNTER — Telehealth (HOSPITAL_COMMUNITY): Admitting: Psychiatry

## 2023-06-29 ENCOUNTER — Encounter (HOSPITAL_COMMUNITY): Payer: Self-pay | Admitting: Psychiatry

## 2023-06-29 DIAGNOSIS — F331 Major depressive disorder, recurrent, moderate: Secondary | ICD-10-CM | POA: Diagnosis not present

## 2023-06-29 DIAGNOSIS — F411 Generalized anxiety disorder: Secondary | ICD-10-CM

## 2023-06-29 DIAGNOSIS — F4010 Social phobia, unspecified: Secondary | ICD-10-CM

## 2023-06-29 NOTE — Progress Notes (Signed)
 [facility] Follow up visit  Patient Identification: Joseph [name] MRN:  [id number] Date of Evaluation:  06/29/2023 Referral Source: Therapist Chief Complaint:   No chief complaint on file. Follow up mood /depression  Visit Diagnosis:    ICD-10-CM   1. Major depressive disorder, recurrent episode, moderate with anxious distress (HCC)  F33.1     2. GAD (generalized anxiety disorder)  F41.1     3. Social anxiety disorder  F40.10     Virtual Visit via Video Note  I connected with Joseph [name] on 06/29/23 at  1:30 PM EDT by a video enabled telemedicine application and verified that I am speaking with the correct person using two identifiers.  Location: Patient: home Provider: home office   I discussed the limitations of evaluation and management by telemedicine and the availability of in person appointments. The patient expressed understanding and agreed to proceed.    I discussed the assessment and treatment plan with the patient. The patient was provided an opportunity to ask questions and all were answered. The patient agreed with the plan and demonstrated an understanding of the instructions.   The patient was advised to call back or seek an in-person evaluation if the symptoms worsen or if the condition fails to improve as anticipated.  I provided 16 minutes of non-face-to-face time during this encounter.     History of Present Illness: Patient is a single 18 years old white male lives with his parents plans to complete his GED this year initially  referred by her therapist to establish care with history of depression.    Patient diagnosed with depression, anxiety, social anxiety  On eval doing fair, managing stress and is in therapy, feels effexor  is helping Still hasn't connect with family of telling them of transitioning    Aggravating factors; difficult schooling , how to tell of transitioning to family when ready  Modifying factors: video games, music  Duration more then  3 years  Hospital admission: denies  Suicide attempt: denies  Past Psychiatric History: depression  Previous Psychotropic Medications: Yes  Prozac didn't help Substance Abuse History in the last 12 months:  No.  Consequences of Substance Abuse: NA  Past Medical History:  Past Medical History:  Diagnosis Date   Class 1 obesity    Seasonal allergies     Past Surgical History:  Procedure Laterality Date   MANDIBLE SURGERY      Family Psychiatric History: mom; bipolar, brother [name]: bipolar  Family History:  Family History  Problem Relation Age of Onset   Rheum arthritis Father    Heart attack Father    Heart disease Father    Diabetes Maternal Grandmother     Social History:   Social History   Socioeconomic History   Marital status: Single    Spouse name: Not on file   Number of children: Not on file   Years of education: Not on file   Highest education level: Not on file  Occupational History   Not on file  Tobacco Use   Smoking status: Passive Smoke Exposure - Never Smoker   Smokeless tobacco: Never  Substance and Sexual Activity   Alcohol use: No   Drug use: No   Sexual activity: Not on file  Other Topics Concern   Not on file  Social History Narrative   Not on file   Social Drivers of Health   Financial Resource Strain: Low Risk  (02/10/2023)   Received from [facility]   Overall Financial Resource Strain (  CARDIA)    Difficulty of Paying Living Expenses: Not very hard  Food Insecurity: No Food Insecurity (02/10/2023)   [facility] from [facility]   Hunger Vital Sign    Worried About Running Out of Food in the Last Year: Never true    Ran Out of Food in the Last Year: Never true  Transportation Needs: No Transportation Needs (02/10/2023)   Received from [facility] - Transportation    Lack of Transportation (Medical): No    Lack of Transportation (Non-Medical): No  Physical Activity: Unknown (02/10/2023)   Received from [facility]   Exercise Vital Sign    Days of Exercise per Week: 0 days    Minutes of Exercise per Session: Not on file  Stress: Stress Concern Present (02/10/2023)   Received from [facility] of Occupational Health - Occupational Stress Questionnaire    Feeling of Stress : To some extent  Social Connections: Somewhat Isolated (02/10/2023)   [facility]   Social Network    How would you rate your social network (family, work, friends)?: Restricted participation with some degree of social isolation     Allergies:  No Known Allergies  Metabolic Disorder Labs: No results found for: "HGBA1C", "MPG" No results found for: "PROLACTIN" No results found for: "CHOL", "TRIG", "HDL", "CHOLHDL", "VLDL", "LDLCALC" No results found for: "TSH"  Therapeutic Level Labs: No results found for: "LITHIUM" No results found for: "CBMZ" No results found for: "VALPROATE"  Current Medications: Current Outpatient Medications  Medication Sig Dispense Refill   venlafaxine  XR (EFFEXOR  XR) 75 MG 24 hr capsule Take 1 capsule (75 mg total) by mouth daily with breakfast.     No current facility-administered medications for this visit.    Psychiatric Specialty Exam: Review of Systems  Cardiovascular:  Negative for chest pain.  Psychiatric/Behavioral:  Negative for agitation.     There were no vitals taken for this visit.There is no height or weight on file to calculate BMI.  General Appearance: Casual  Eye Contact:  Fair  Speech:  Clear and Coherent  Volume:  Decreased  Mood:  fair  Affect:  Congruent  Thought Process:  Goal Directed  Orientation:  Full (Time, Place, and Person)  Thought Content:  Rumination  Suicidal Thoughts:  No  Homicidal Thoughts:  No  Memory:  Immediate;   Fair  Judgement:  Fair  Insight:  Fair  Psychomotor Activity:  Decreased  Concentration:  Concentration: Fair  Recall:  [employer] of Knowledge:Good  Language: Good  Akathisia:  No   Handed:    AIMS (if indicated):  not done  Assets:  Desire for Improvement  ADL's:  Intact  Cognition: WNL  Sleep:  irregular   Screenings: GAD-7    Advertising copywriter from 06/02/2023 in [facility] at [facility] Counselor from 04/26/2023 in [facility] at [facility] Counselor from 04/13/2023 in [facility] at [facility] Counselor from 03/30/2023 in [facility] Outpatient Behavioral [facility] at [facility] Counselor from 03/14/2023 in [facility] Out[facility] Health at [facility]  Total GAD-7 Score 9 9 8 10 9       PHQ2-9    Flowsheet Row Counselor from 06/02/2023 in [facility] Outpatient Behavioral Health at [facility] Counselor from 04/26/2023 in [facility] Outpatient Behavioral Health at [facility] Counselor from 04/13/2023 in [facility] Outpatient Behavioral Health at [facility] Counselor from 03/30/2023 in  [place] Outpatient Behavioral Health at [facility] Office Visit from 03/17/2023 in [facility] Outpatient Behavioral Health at [facility]  PHQ-2 Total Score 4 4 4 3 3   PHQ-9 Total Score 13 11 11 12 10       Flowsheet Row Video Visit from 04/20/2023 in [facility] Outpatient Behavioral Health at [facility] Counselor from 06/25/2021 in [facility] Outpatient Behavioral Health at [facility]  C-SSRS RISK CATEGORY No Risk Error: Q7 should not be populated when Q6 is No       Assessment and Plan: as folllows  Prior documentation reviewed   Major depressive disorder recurrent moderate; improving continue effexor    Generalized anxiety disorder; better and in therapy also helping continue effexor  and coping skills  Social anxiety disorder; still working on it and continue therapy, not worse .   Fu 2 - 45m. Has meds for now Collaboration of Care: Other  notes and chart, reviewed of therapist   Patient/Guardian was advised Release of Information must be obtained prior to any record release in order to collaborate their care with an outside provider. Patient/Guardian was advised if they have not already done so to contact the registration department to sign all necessary forms in order for us  to release information regarding their care.   Consent: Patient/Guardian gives verbal consent for treatment and assignment of benefits for services provided during this visit. Patient/Guardian expressed understanding and agreed to proceed.   Wray Heady, MD 6/4/20251:28 PM

## 2023-06-30 ENCOUNTER — Ambulatory Visit (HOSPITAL_COMMUNITY): Admitting: Licensed Clinical Social Worker

## 2023-07-14 ENCOUNTER — Ambulatory Visit (INDEPENDENT_AMBULATORY_CARE_PROVIDER_SITE_OTHER): Admitting: Licensed Clinical Social Worker

## 2023-07-14 DIAGNOSIS — F411 Generalized anxiety disorder: Secondary | ICD-10-CM | POA: Diagnosis not present

## 2023-07-14 DIAGNOSIS — F331 Major depressive disorder, recurrent, moderate: Secondary | ICD-10-CM | POA: Diagnosis not present

## 2023-07-14 NOTE — Progress Notes (Signed)
 Joseph [name]

## 2023-07-14 NOTE — Progress Notes (Signed)
 THERAPIST PROGRESS NOTE  Session Time: 1:05 pm-1:45 pm  Type of Therapy: Individual Therapy  Purpose of session: Azarias will manage mood and anxiety as evidenced by returning to a previous level of social functioning, increase zest for life, challenge depressive and anxious thinking, improve sleep and express emotions appropriately for 5 out of 7 days for 60 days.  ProgressTowards Goals: Progressing  Interventions: Therapist utilized CBT and Solution focused brief therapy to address mood and anxiety. Therapist provided support and empathy to patient during session. Therapist administered PHQ9 and GAD7 to patient. Therapist worked with patient on returning to a previous level of functioning.   Effectiveness:  Patient was oriented x4 (person, place, situation, and time). Patient was casually dressed, and appropriately groomed. Patient was alert, engaged, pleasant, and cooperative. Patient noted they have been feeling ok. Patient has had ups and downs but has been stable. Patient feels like the medication is helpful. Patient has been engaging with friends and playing a video game with them. Patient has invited friends to play and has been invited to play. Patient has also not accepted invitations to play. Patient understood that doesn't make them a bad friend, etc and their friends still talk to them. Patient understood they can invite or decline the invitation while maintaining friends.   Patient engaged in session. He responded well to interventions. Patient continues to meet criteria for Major depressive disorder, recurrent episode, moderate with anxious distress (HCC)  GAD (generalized anxiety disorder) . Patient will continue in outpatient therapy due to being the least restrictive service to meet his needs. Patient made moderate progress on his goals.     07/14/2023    1:14 PM 06/02/2023    1:09 PM 04/26/2023    1:10 PM  Depression screen PHQ 2/9  Decreased Interest 2 2 2   Down, Depressed,  Hopeless 2 2 2   PHQ - 2 Score 4 4 4   Altered sleeping 2 2 2   Tired, decreased energy 2 2 2   Change in appetite 1 1 1   Feeling bad or failure about yourself  2 2 1   Trouble concentrating 1 1 0  Moving slowly or fidgety/restless 1 0 0  Suicidal thoughts 1 1 1   PHQ-9 Score 14 13 11        07/14/2023    1:14 PM 06/02/2023    1:09 PM 04/26/2023    1:11 PM 04/13/2023    1:03 PM  GAD 7 : Generalized Anxiety Score  Nervous, Anxious, on Edge 1 2 2 1   Control/stop worrying 1 1 1 1   Worry too much - different things 2 2 2 2   Trouble relaxing 1 2 2 1   Restless 1 1 1 1   Easily annoyed or irritable 2 1 1 1   Afraid - awful might happen 1 0 0 1  Total GAD 7 Score 9 9 9 8   Anxiety Difficulty Somewhat difficult Somewhat difficult Somewhat difficult Somewhat difficult     Suicidal/Homicidal: Nowithout intent/plan  Plan: Return again in 2-4 weeks.   Diagnosis: Major depressive disorder, recurrent episode, moderate with anxious distress (HCC)  GAD (generalized anxiety disorder)  Collaboration of Care: Other sources will be identified.   Patient/Guardian was advised Release of Information must be obtained prior to any record release in order to collaborate their care with an outside provider. Patient/Guardian was advised if they have not already done so to contact the registration department to sign all necessary forms in order for us  to release information regarding their care.   Consent: Patient/Guardian  gives verbal consent for treatment and assignment of benefits for services provided during this visit. Patient/Guardian expressed understanding and agreed to proceed.   Braxton Calico, LCSW 07/14/2023

## 2023-07-27 ENCOUNTER — Ambulatory Visit (HOSPITAL_COMMUNITY): Admitting: Licensed Clinical Social Worker

## 2023-08-21 ENCOUNTER — Other Ambulatory Visit (HOSPITAL_COMMUNITY): Payer: Self-pay | Admitting: Psychiatry

## 2023-08-25 ENCOUNTER — Ambulatory Visit (INDEPENDENT_AMBULATORY_CARE_PROVIDER_SITE_OTHER): Admitting: Licensed Clinical Social Worker

## 2023-08-25 DIAGNOSIS — F4010 Social phobia, unspecified: Secondary | ICD-10-CM

## 2023-08-25 DIAGNOSIS — F331 Major depressive disorder, recurrent, moderate: Secondary | ICD-10-CM

## 2023-08-25 NOTE — Progress Notes (Signed)
 THERAPIST PROGRESS NOTE  Session Time: 1:05 pm-1:45 pm  Type of Therapy: Individual Therapy  Purpose of session: Joseph [name] will manage mood and anxiety as evidenced by returning to a previous level of social functioning, increase zest for life, challenge depressive and anxious thinking, improve sleep and express emotions appropriately for 5 out of 7 days for 60 days.  ProgressTowards Goals: Progressing  Interventions: Therapist utilized CBT and Solution focused brief therapy to address mood and anxiety. Therapist provided support and empathy to patient during session. Therapist administered PHQ9 and GAD7 to patient. Therapist worked with patient on increasing zest for life and expressing emotions appropriately.   Effectiveness:  Patient was oriented x4 (person, place, situation, and time). Patient was casually dressed, and appropriately groomed. Patient was alert, engaged, pleasant, and cooperative. Patient noted that he has started writing again and enjoying it. Patient is working on Editor, commissioning bios and other things. Patient has been playing D and D. He has gotten some good feedback on what he has been doing with the games he is leading. Patient also feels trapped/stuck. Patient feels certain of his gender identity as trans but isn't sure how to have the conversation with his parent. He doesn't feel like he will be kicked out of the home but also doesn't know how his parents will react. He has been voice training online in voice chats with others to have a fem sounding voice. Patient isn't sure if he could get gender affirming care due to political changes/funding. Patient understood that there are locations available for care. Patient understood that the pain of feeling stuck will eventually outweigh the pain of having a conversation with his parents about being trans. Patient is going to focus on what he can control right now and continue to be his authentic self online until he is ready to have a  conversation with his parents.   Patient engaged in session. He responded well to interventions. Patient continues to meet criteria for No diagnosis found. . Patient will continue in outpatient therapy due to being the least restrictive service to meet his needs. Patient made moderate progress on his goals.     08/25/2023    1:10 PM 07/14/2023    1:14 PM 06/02/2023    1:09 PM  Depression screen PHQ 2/9  Decreased Interest 2 2 2   Down, Depressed, Hopeless 2 2 2   PHQ - 2 Score 4 4 4   Altered sleeping 2 2 2   Tired, decreased energy 3 2 2   Change in appetite 1 1 1   Feeling bad or failure about yourself  2 2 2   Trouble concentrating 1 1 1   Moving slowly or fidgety/restless 0 1 0  Suicidal thoughts 1 1 1   PHQ-9 Score 14 14 13        08/25/2023    1:10 PM 07/14/2023    1:14 PM 06/02/2023    1:09 PM 04/26/2023    1:11 PM  GAD 7 : Generalized Anxiety Score  Nervous, Anxious, on Edge 1 1 2 2   Control/stop worrying 2 1 1 1   Worry too much - different things 2 2 2 2   Trouble relaxing 1 1 2 2   Restless 1 1 1 1   Easily annoyed or irritable 2 2 1 1   Afraid - awful might happen 1 1 0 0  Total GAD 7 Score 10 9 9 9   Anxiety Difficulty Somewhat difficult Somewhat difficult Somewhat difficult Somewhat difficult     Suicidal/Homicidal: Nowithout intent/plan  Plan: Return again in 2-4 weeks.  Diagnosis: No diagnosis found.  Collaboration of Care: Other sources will be identified.   Patient/Guardian was advised Release of Information must be obtained prior to any record release in order to collaborate their care with an outside provider. Patient/Guardian was advised if they have not already done so to contact the registration department to sign all necessary forms in order for us  to release information regarding their care.   Consent: Patient/Guardian gives verbal consent for treatment and assignment of benefits for services provided during this visit. Patient/Guardian expressed understanding and  agreed to proceed.   Fonda Conroy, LCSW 08/25/2023

## 2023-09-05 ENCOUNTER — Ambulatory Visit (INDEPENDENT_AMBULATORY_CARE_PROVIDER_SITE_OTHER): Admitting: Licensed Clinical Social Worker

## 2023-09-05 DIAGNOSIS — F331 Major depressive disorder, recurrent, moderate: Secondary | ICD-10-CM

## 2023-09-05 NOTE — Progress Notes (Signed)
 THERAPIST PROGRESS NOTE  Session Time: 1:03 pm-1:45 pm  Type of Therapy: Individual Therapy  Purpose of session: Uziel will manage mood and anxiety as evidenced by returning to a previous level of social functioning, increase zest for life, challenge depressive and anxious thinking, improve sleep and express emotions appropriately for 5 out of 7 days for 60 days.  ProgressTowards Goals: Progressing  Interventions: Therapist utilized CBT and Solution focused brief therapy to address mood and anxiety. Therapist provided support and empathy to patient during session. Therapist worked with patient on increasing social functioning and increase zest for life.   Effectiveness:  Patient was oriented x4 (person, place, situation, and time). Patient was casually dressed, and appropriately groomed. Patient was alert, engaged, pleasant, and cooperative. Patient reported that they have been feeling more down lately. They feel like there has been no specific event that caused the feelings but did recognize after processing feelings that they had been feeling lonely. They continue to have a narrative about themself that they are a burden to others or will bother others so they don't reach out to people. Patient feels like they want people in their life that want to have conversations with them and want them around. Patient feels welcomed and cared for when they can talk about random things in their life with others and not feel judged. Patient is going to start to reach out to friends online to start conversations. By their own admission, they recognize that the narrative about being annoying or bothersome stops conversations before they begin and further increases feelings of loneliness. Patient wants to do things in life like finish school, get a job, and work on their health. They also understood that if nothing changes, nothing changes.   Patient engaged in session. He responded well to interventions. Patient  continues to meet criteria for Major depressive disorder, recurrent episode, moderate with anxious distress (HCC) . Patient will continue in outpatient therapy due to being the least restrictive service to meet his needs. Patient made moderate progress on his goals.     08/25/2023    1:10 PM 07/14/2023    1:14 PM 06/02/2023    1:09 PM  Depression screen PHQ 2/9  Decreased Interest 2 2 2   Down, Depressed, Hopeless 2 2 2   PHQ - 2 Score 4 4 4   Altered sleeping 2 2 2   Tired, decreased energy 3 2 2   Change in appetite 1 1 1   Feeling bad or failure about yourself  2 2 2   Trouble concentrating 1 1 1   Moving slowly or fidgety/restless 0 1 0  Suicidal thoughts 1 1 1   PHQ-9 Score 14 14 13        08/25/2023    1:10 PM 07/14/2023    1:14 PM 06/02/2023    1:09 PM 04/26/2023    1:11 PM  GAD 7 : Generalized Anxiety Score  Nervous, Anxious, on Edge 1 1 2 2   Control/stop worrying 2 1 1 1   Worry too much - different things 2 2 2 2   Trouble relaxing 1 1 2 2   Restless 1 1 1 1   Easily annoyed or irritable 2 2 1 1   Afraid - awful might happen 1 1 0 0  Total GAD 7 Score 10 9 9 9   Anxiety Difficulty Somewhat difficult Somewhat difficult Somewhat difficult Somewhat difficult     Suicidal/Homicidal: Nowithout intent/plan  Plan: Return again in 2-4 weeks.   Diagnosis: Major depressive disorder, recurrent episode, moderate with anxious distress (HCC)  Collaboration  of Care: Other sources will be identified.   Patient/Guardian was advised Release of Information must be obtained prior to any record release in order to collaborate their care with an outside provider. Patient/Guardian was advised if they have not already done so to contact the registration department to sign all necessary forms in order for us  to release information regarding their care.   Consent: Patient/Guardian gives verbal consent for treatment and assignment of benefits for services provided during this visit. Patient/Guardian expressed  understanding and agreed to proceed.   Fonda Conroy, LCSW 09/05/2023

## 2023-09-19 ENCOUNTER — Encounter (HOSPITAL_COMMUNITY): Payer: Self-pay

## 2023-09-19 ENCOUNTER — Ambulatory Visit (HOSPITAL_COMMUNITY): Admitting: Licensed Clinical Social Worker

## 2023-09-28 ENCOUNTER — Encounter (HOSPITAL_COMMUNITY): Payer: Self-pay | Admitting: Psychiatry

## 2023-09-28 ENCOUNTER — Telehealth (HOSPITAL_COMMUNITY): Admitting: Psychiatry

## 2023-09-28 DIAGNOSIS — F4010 Social phobia, unspecified: Secondary | ICD-10-CM | POA: Diagnosis not present

## 2023-09-28 DIAGNOSIS — F411 Generalized anxiety disorder: Secondary | ICD-10-CM | POA: Diagnosis not present

## 2023-09-28 DIAGNOSIS — F331 Major depressive disorder, recurrent, moderate: Secondary | ICD-10-CM

## 2023-09-28 MED ORDER — VENLAFAXINE HCL ER 75 MG PO CP24: 75.0000 mg | ORAL_CAPSULE | Freq: Every day | ORAL | 2 refills | Status: DC

## 2023-09-28 NOTE — Progress Notes (Signed)
 [facility] Follow up visit  Patient Identification: Joseph [name] MRN:  [id number] Date of Evaluation:  09/28/2023 Referral Source: Therapist Chief Complaint:   No chief complaint on file. Follow up mood /depression  Visit Diagnosis:    ICD-10-CM   1. Major depressive disorder, recurrent episode, moderate with anxious distress (HCC)  F33.1     2. Social anxiety disorder  F40.10     3. GAD (generalized anxiety disorder)  F41.1     Virtual Visit via Video Note  I connected with Demichael Tan on 09/28/23 at  1:30 PM EDT by a video enabled telemedicine application and verified that I am speaking with the correct person using two identifiers.  Location: Patient: home Provider: home office   I discussed the limitations of evaluation and management by telemedicine and the availability of in person appointments. The patient expressed understanding and agreed to proceed.      I discussed the assessment and treatment plan with the patient. The patient was provided an opportunity to ask questions and all were answered. The patient agreed with the plan and demonstrated an understanding of the instructions.   The patient was advised to call back or seek an in-person evaluation if the symptoms worsen or if the condition fails to improve as anticipated.  I provided 15 minutes of non-face-to-face time during this encounter.    History of Present Illness: Patient is a single 18 years old white male lives with his parents plans to complete his GED this year initially  referred by her therapist to establish care with history of depression.    Patient diagnosed with depression, anxiety, social anxiety  On evaluation doing reasonable tolerating medication is in therapy.  Medications keeping some balance   Aggravating factors; difficult schooling, how to tell of transitioning to family when ready  Modifying factors: video games, music  Duration more then 3 years  Hospital admission: denies   Suicide attempt: denies  Past Psychiatric History: depression  Previous Psychotropic Medications: Yes  Prozac didn't help Substance Abuse History in the last 12 months:  No.  Consequences of Substance Abuse: NA  Past Medical History:  Past Medical History:  Diagnosis Date   Class 1 obesity    Seasonal allergies     Past Surgical History:  Procedure Laterality Date   MANDIBLE SURGERY      Family Psychiatric History: mom; bipolar, brother esta: bipolar  Family History:  Family History  Problem Relation Age of Onset   Rheum arthritis Father    Heart attack Father    Heart disease Father    Diabetes Maternal Grandmother     Social History:   Social History   Socioeconomic History   Marital status: Single    Spouse name: Not on file   Number of children: Not on file   Years of education: Not on file   Highest education level: Not on file  Occupational History   Not on file  Tobacco Use   Smoking status: Passive Smoke Exposure - Never Smoker   Smokeless tobacco: Never  Substance and Sexual Activity   Alcohol use: No   Drug use: No   Sexual activity: Not on file  Other Topics Concern   Not on file  Social History Narrative   Not on file   Social Drivers of Health   Financial Resource Strain: Low Risk  (02/10/2023)   Received from [facility]   Overall Financial Resource Strain (CARDIA)    Difficulty of Paying Living Expenses: Not very  hard  Food Insecurity: No Food Insecurity (02/10/2023)   Received from [facility]   Hunger Vital Sign    Within the past 12 months, you worried that your food would run out before you got the money to buy more.: Never true    Within the past 12 months, the food you bought just didn't last and you didn't have money to get more.: Never true  Transportation Needs: No Transportation Needs (02/10/2023)   Received from [facility]   PRAPARE - Transportation    Lack of Transportation (Medical): No    Lack of Transportation  (Non-Medical): No  Physical Activity: Unknown (02/10/2023)   Received from [facility]   Exercise Vital Sign    On average, how many days per week do you engage in moderate to strenuous exercise (like a brisk walk)?: 0 days    Minutes of Exercise per Session: Not on file  Stress: Stress Concern Present (02/10/2023)   Received from [facility] - Occupational Stress Questionnaire    Feeling of Stress : To some extent  Social Connections: Somewhat Isolated (02/10/2023)   Received [facility]   Social Network    How would you rate your social network (family, work, friends)?: Restricted participation with some degree of social isolation     Allergies:  No Known Allergies  Metabolic Disorder Labs: No results found for: HGBA1C, MPG No results found for: PROLACTIN No results found for: CHOL, TRIG, HDL, CHOLHDL, VLDL, LDLCALC No results found for: TSH  Therapeutic Level Labs: No results found for: LITHIUM No results found for: CBMZ No results found for: VALPROATE  Current Medications: Current Outpatient Medications  Medication Sig Dispense Refill   venlafaxine  XR (EFFEXOR  XR) 75 MG 24 hr capsule Take 1 capsule (75 mg total) by mouth daily with breakfast. 30 capsule 2   No current facility-administered medications for this visit.    Psychiatric Specialty Exam: Review of Systems  Cardiovascular:  Negative for chest pain.  Psychiatric/Behavioral:  Negative for agitation.     There were no vitals taken for this visit.There is no height or weight on file to calculate BMI.  General Appearance: Casual  Eye Contact:  Fair  Speech:  Clear and Coherent  Volume:  Decreased  Mood:  fair  Affect:  Congruent  Thought Process:  Goal Directed  Orientation:  Full (Time, Place, and Person)  Thought Content:  Rumination  Suicidal Thoughts:  No  Homicidal Thoughts:  No  Memory:  Immediate;   Fair  Judgement:   Fair  Insight:  Fair  Psychomotor Activity:  Decreased  Concentration:  Concentration: Fair  Recall:  [employer] of Knowledge:Good  Language: Good  Akathisia:  No  Handed:    AIMS (if indicated):  not done  Assets:  Desire for Improvement  ADL's:  Intact  Cognition: WNL  Sleep:  irregular   Screenings: GAD-7    Advertising copywriter from 08/25/2023 in [facility] at [facility] Counselor from 07/14/2023 in [facility] Out[facility]al [facility] at [facility] Counselor from 06/02/2023 in [facility] Outpatient [facility]al Health at [facility] Counselor from 04/26/2023 in [facility] Outpatient Behavioral Health at [facility] Counselor from 04/13/2023 in [facility] Outpatient Behavioral Health at [facility]  Total GAD-7 Score 10 9 9 9 8    PHQ2-9    Flowsheet Row Counselor from 08/25/2023 in [facility] Outpatient Behavioral Health at [facility] Counselor from 07/14/2023 in   Outpatient Behavioral Health at [facility] Counselor from 06/02/2023 in [facility] Outpatient Behavioral Health at [facility] Counselor from 04/26/2023 in [facility] Outpatient Behavioral Health at [facility] Counselor from 04/13/2023 in [facility] Outpatient Behavioral Health at [facility]  PHQ-2 Total Score 4 4 4 4 4   PHQ-9 Total Score 14 14 13 11 11    Flowsheet Row Video Visit from 06/29/2023 in [facility] Outpatient Behavioral Health at [facility] Video Visit from 04/20/2023 in [facility] Outpatient Behavioral Health at [facility] Counselor from 06/25/2021 in [facility] Outpatient Behavioral Health at [facility]  C-SSRS RISK CATEGORY No Risk No Risk Error: Q7 should not be populated when Q6 is No    Assessment and Plan: as folllows  Prior documentation reviewed   Major depressive disorder recurrent moderate; manageable  continue Effexor  now at a dose of 150 mg  Generalized anxiety disorder; manageable continue therapy and Effexor  at the current dose of 150 mg refill sent   social anxiety disorder; still working on it and continue therapy, not worse .  Some improvement  Follow-up in 3 months or earlier if needed renewed prescription questions addressed Collaboration of Care: Other notes and chart, reviewed of therapist   Patient/Guardian was advised Release of Information must be obtained prior to any record release in order to collaborate their care with an outside provider. Patient/Guardian was advised if they have not already done so to contact the registration department to sign all necessary forms in order for us  to release information regarding their care.   Consent: Patient/Guardian gives verbal consent for treatment and assignment of benefits for services provided during this visit. Patient/Guardian expressed understanding and agreed to proceed.   Jackey Flight, MD 9/3/20251:32 PM

## 2023-10-03 ENCOUNTER — Ambulatory Visit (HOSPITAL_COMMUNITY): Admitting: Licensed Clinical Social Worker

## 2023-10-03 DIAGNOSIS — F331 Major depressive disorder, recurrent, moderate: Secondary | ICD-10-CM

## 2023-10-03 DIAGNOSIS — F419 Anxiety disorder, unspecified: Secondary | ICD-10-CM | POA: Diagnosis not present

## 2023-10-03 NOTE — Progress Notes (Unsigned)
 THERAPIST PROGRESS NOTE  Session Time: 1:03 pm-1:45 pm  Type of Therapy: Individual Therapy  Treatment Goal: Joseph [name] will manage mood and anxiety as evidenced by returning to a previous level of social functioning, increase zest for life, challenge depressive and anxious thinking, improve sleep and express emotions appropriately for 5 out of 7 days for 60 days.  Session Goal: Expectations, Safety plan   Behavior: Patient noted that things have been difficult. He was without his medication for a week and his passive SI increased. He got his medication refill and is currently taking his medication. Patient was oriented x4 (person, place, situation, and time). Patient was casually dressed, and appropriately groomed. Patient was alert, engaged, pleasant, and cooperative.Patient made moderate progress on his goals.  Interventions: Therapist discussed with patient the expectations for therapy, his expectations for therapist, therapist expectations for patient, and how patient will know when treatment has been successful. Therapist used CBT interventions to help patient identify thoughts and feelings to complete an safety plan to address passive SI.   Response: Patient was able to verbalize passive thoughts of not wanting to be here at times with no plan or means. Plan is below.   Patient engaged in session. He responded well to interventions. Patient continues to meet criteria for Major depressive disorder, recurrent episode, moderate with anxious distress (HCC) . Patient will continue in outpatient therapy due to being the least restrictive service to meet his needs.   Plan: Return again in 2-4 weeks. Patient will use safety plan as needed.   Suicidal/Homicidal: Nowithout intent/plan  Collaboration of Care: Other sources will be identified.   Completed with: Benjamin Mathison Date: 10/03/2023 1:36 PM   Patient expressed: No plan to harm self or others  Increased risk due to: Poor interpersonal  relationships and support system and Feelings of depression and/or anhedonia  Mitigating factors include: Living with another person, especially a relative Positive therapeutic relationship Future goals Friends   Warning Signs discussed with patient: 1) Unwillingness to talk  2) Isolations  3) Feeling like everything they do is not enough/not good enough, what they are doing is under intense scrutiny   Coping Strategies:  1) Distraction: doing something that requires thinking including writing, talking, or doing something that takes their mind off things,  2) Talk to someone: Mother or brother: Joseph [name] 3) Journaling   People to help and assist with distraction:  1) Mother: Joseph [name] 2) Joseph [name]  3) Best friend: Joseph [name]   Professionals Available:  Agency:  [facility] [place]  Clinician: Sidra Conroy, LCSW-[id number] Emergency: Please call 911  Suicide Hotline 1-800-273-TALK 570-830-0746)  Reasons for living mentioned by the patient: Future goals: not being the person I want to become, Living with others, and so much stuff that I want to see   Summary: Joseph [name] has a history of depression and passive suicidal thoughts. On one occasional after an argument with their father in the last year that triggered the above mentioned thoughts and to Joseph [name] having suicidal thoughts as well as a plan. They expressed their thoughts to their father who then called their mother. Their mother came home from work immediately and Joseph [name] was able to open up about what they were feeling. While talking to someone is the top priority, Joseph [name] has other distractions/coping skills to use until they have time to open up.   Phone Number to National Suicide Hotline provided: Call or text 988.  [facility] [place] 9866034250 or [phone]     08/25/2023  1:10 PM 07/14/2023    1:14 PM 06/02/2023    1:09 PM  Depression screen PHQ 2/9  Decreased Interest 2 2 2   Down, Depressed, Hopeless 2 2  2   PHQ - 2 Score 4 4 4   Altered sleeping 2 2 2   Tired, decreased energy 3 2 2   Change in appetite 1 1 1   Feeling bad or failure about yourself  2 2 2   Trouble concentrating 1 1 1   Moving slowly or fidgety/restless 0 1 0  Suicidal thoughts 1 1 1   PHQ-9 Score 14 14 13        08/25/2023    1:10 PM 07/14/2023    1:14 PM 06/02/2023    1:09 PM 04/26/2023    1:11 PM  GAD 7 : Generalized Anxiety Score  Nervous, Anxious, on Edge 1 1 2 2   Control/stop worrying 2 1 1 1   Worry too much - different things 2 2 2 2   Trouble relaxing 1 1 2 2   Restless 1 1 1 1   Easily annoyed or irritable 2 2 1 1   Afraid - awful might happen 1 1 0 0  Total GAD 7 Score 10 9 9 9   Anxiety Difficulty Somewhat difficult Somewhat difficult Somewhat difficult Somewhat difficult    Patient/Guardian was advised Release of Information must be obtained prior to any record release in order to collaborate their care with an outside provider. Patient/Guardian was advised if they have not already done so to contact the registration department to sign all necessary forms in order for us  to release information regarding their care.   Consent: Patient/Guardian gives verbal consent for treatment and assignment of benefits for services provided during this visit. Patient/Guardian expressed understanding and agreed to proceed.   Fonda Conroy, LCSW 10/03/2023

## 2023-10-18 ENCOUNTER — Ambulatory Visit (HOSPITAL_COMMUNITY): Admitting: Licensed Clinical Social Worker

## 2023-10-18 DIAGNOSIS — F331 Major depressive disorder, recurrent, moderate: Secondary | ICD-10-CM | POA: Diagnosis not present

## 2023-10-18 DIAGNOSIS — F411 Generalized anxiety disorder: Secondary | ICD-10-CM

## 2023-10-18 NOTE — Progress Notes (Signed)
 THERAPIST PROGRESS NOTE  Session Time: 1:03 pm-1:45 pm  Type of Therapy: Individual Therapy  Treatment Goal: Arlando will manage mood and anxiety as evidenced by returning to a previous level of social functioning, increase zest for life, challenge depressive and anxious thinking, improve sleep and express emotions appropriately for 5 out of 7 days for 60 days.  Session Goal: Challenge depressive and anxious thinking  Behavior:  Patient has been coasting. He has been playing video games, and has applied to work at [employer] for the seasonal position. Patient is going to wait on his GED. Patient was oriented x4 (person, place, situation, and time). Patient was casually dressed, and appropriately groomed. Patient was alert, engaged, pleasant, and cooperative.Patient made moderate progress on his goals.  Interventions: Therapist used DBT interventions of dialectical thinking and interpersonal relationships/healthy communication.   Response: Patient's sister and niece are living with his family. Patient noted his sister is causing drama in the home by pawning his niece off on other people while his sister does other things and work. He noted she isn't taking the time to spend with his niece when she isn't working. Patient wants to stay ou tof the drama but also his mother talks to him about it. Patient feels like he can validate his mother and not get pulled into things. Patient also noted that one of his friends was having SI and he supported his friend. He was able to be there for his friend but also not feel pressure to fix things like he has before.   Patient engaged in session. He responded well to interventions. Patient continues to meet criteria for Major depressive disorder, recurrent episode, moderate with anxious distress (HCC)  GAD (generalized anxiety disorder) . Patient will continue in outpatient therapy due to being the least restrictive service to meet his needs.   Plan: Return  again in 2-4 weeks. Patient use dialectical thinking and healthy communication with family in the home.   Suicidal/Homicidal: Nowithout intent/plan  Collaboration of Care: Other sources will be identified.       08/25/2023    1:10 PM 07/14/2023    1:14 PM 06/02/2023    1:09 PM  Depression screen PHQ 2/9  Decreased Interest 2 2 2   Down, Depressed, Hopeless 2 2 2   PHQ - 2 Score 4 4 4   Altered sleeping 2 2 2   Tired, decreased energy 3 2 2   Change in appetite 1 1 1   Feeling bad or failure about yourself  2 2 2   Trouble concentrating 1 1 1   Moving slowly or fidgety/restless 0 1 0  Suicidal thoughts 1 1 1   PHQ-9 Score 14 14 13        08/25/2023    1:10 PM 07/14/2023    1:14 PM 06/02/2023    1:09 PM 04/26/2023    1:11 PM  GAD 7 : Generalized Anxiety Score  Nervous, Anxious, on Edge 1 1 2 2   Control/stop worrying 2 1 1 1   Worry too much - different things 2 2 2 2   Trouble relaxing 1 1 2 2   Restless 1 1 1 1   Easily annoyed or irritable 2 2 1 1   Afraid - awful might happen 1 1 0 0  Total GAD 7 Score 10 9 9 9   Anxiety Difficulty Somewhat difficult Somewhat difficult Somewhat difficult Somewhat difficult    Patient/Guardian was advised Release of Information must be obtained prior to any record release in order to collaborate their care with an outside provider. Patient/Guardian  was advised if they have not already done so to contact the registration department to sign all necessary forms in order for us  to release information regarding their care.   Consent: Patient/Guardian gives verbal consent for treatment and assignment of benefits for services provided during this visit. Patient/Guardian expressed understanding and agreed to proceed.   Fonda Conroy, LCSW 10/18/2023

## 2023-10-25 ENCOUNTER — Telehealth (HOSPITAL_COMMUNITY): Payer: Self-pay | Admitting: *Deleted

## 2023-10-25 NOTE — Telephone Encounter (Signed)
 Patient pharmacy [facility] [address] is requesting 90 days supply for patient Venlafaxine  ER 75mg . Patient last appt was 09-28-2023.

## 2023-10-26 MED ORDER — VENLAFAXINE HCL ER 75 MG PO CP24: 75.0000 mg | ORAL_CAPSULE | Freq: Every day | ORAL | 0 refills | Status: DC

## 2023-11-02 ENCOUNTER — Ambulatory Visit (INDEPENDENT_AMBULATORY_CARE_PROVIDER_SITE_OTHER): Admitting: Licensed Clinical Social Worker

## 2023-11-02 DIAGNOSIS — F411 Generalized anxiety disorder: Secondary | ICD-10-CM | POA: Diagnosis not present

## 2023-11-02 DIAGNOSIS — F331 Major depressive disorder, recurrent, moderate: Secondary | ICD-10-CM | POA: Diagnosis not present

## 2023-11-02 NOTE — Progress Notes (Unsigned)
 THERAPIST PROGRESS NOTE  Session Time: 1:00 pm-1:45 pm  Type of Therapy: Individual Therapy  Treatment Goal: Joseph [name] will manage mood and anxiety as evidenced by returning to a previous level of social functioning, increase zest for life, challenge depressive and anxious thinking, improve sleep and express emotions appropriately for 5 out of 7 days for 60 days.  Session Goal: challenge depressive and anxious thinking, increase zest for life  Behavior: Patient noted that he was right that things with his sister boiled over in the home and there was drama. He noted his sister spend some time with her boyfriend after the blow up and things are calm now but he is on edge about how things could go. Patient was oriented x4 (person, place, situation, and time). Patient was casually dressed, and appropriately groomed. Patient was alert, engaged, pleasant, and cooperative.Patient made moderate progress on his goals.  Interventions: Therapist used CBT intervention of cognitive restructuring and behavioral activation to address mood and motivation.   Response: Patient doesn't like yelling that occurs in the home and has been spending time in his room. He has been isolating with family but has been talking with friends online. Patient knows that he can't stay isolated and needs to do things different or things won't change. Patient is willing to work on his physical health. He is going to start walking 2 days a week and continue to limit his sugar intake. Patient is also willing to talk to his family to share his frustrations.   Patient engaged in session. He responded well to interventions. Patient continues to meet criteria for Major depressive disorder, recurrent episode, moderate with anxious distress (HCC)  GAD (generalized anxiety disorder) . Patient will continue in outpatient therapy due to being the least restrictive service to meet his needs.   Plan: Return again in 2-4 weeks. Patient will walk 2  days a week for 20-30 minutes and reduce sugar intake. He will also talk with family and friends about his feelings starting with sharing his frustrations.   Suicidal/Homicidal: Nowithout intent/plan  Collaboration of Care: Other sources will be identified.       08/25/2023    1:10 PM 07/14/2023    1:14 PM 06/02/2023    1:09 PM  Depression screen PHQ 2/9  Decreased Interest 2 2 2   Down, Depressed, Hopeless 2 2 2   PHQ - 2 Score 4 4 4   Altered sleeping 2 2 2   Tired, decreased energy 3 2 2   Change in appetite 1 1 1   Feeling bad or failure about yourself  2 2 2   Trouble concentrating 1 1 1   Moving slowly or fidgety/restless 0 1 0  Suicidal thoughts 1 1 1   PHQ-9 Score 14 14 13        08/25/2023    1:10 PM 3/80/7974    1:14 PM 06/02/2023    1:09 PM 04/26/2023    1:11 PM  GAD 7 : Generalized Anxiety Score  Nervous, Anxious, on Edge 1 1 2 2   Control/stop worrying 2 1 1 1   Worry too much - different things 2 2 2 2   Trouble relaxing 1 1 2 2   Restless 1 1 1 1   Easily annoyed or irritable 2 2 1 1   Afraid - awful might happen 1 1 0 0  Total GAD 7 Score 10 9 9 9   Anxiety Difficulty Somewhat difficult Somewhat difficult Somewhat difficult Somewhat difficult    Patient/Guardian was advised Release of Information must be obtained prior to any record release  in order to collaborate their care with an outside provider. Patient/Guardian was advised if they have not already done so to contact the registration department to sign all necessary forms in order for us  to release information regarding their care.   Consent: Patient/Guardian gives verbal consent for treatment and assignment of benefits for services provided during this visit. Patient/Guardian expressed understanding and agreed to proceed.   Fonda Conroy, LCSW 11/02/2023

## 2023-11-16 ENCOUNTER — Ambulatory Visit (HOSPITAL_COMMUNITY): Admitting: Licensed Clinical Social Worker

## 2023-11-16 DIAGNOSIS — F411 Generalized anxiety disorder: Secondary | ICD-10-CM | POA: Diagnosis not present

## 2023-11-16 DIAGNOSIS — F331 Major depressive disorder, recurrent, moderate: Secondary | ICD-10-CM

## 2023-11-17 NOTE — Progress Notes (Signed)
 THERAPIST PROGRESS NOTE  Session Time: 2:00 pm-2:45 pm  Type of Therapy: Individual Therapy  Treatment Goal: Izaias will manage mood and anxiety as evidenced by returning to a previous level of social functioning, increase zest for life, challenge depressive and anxious thinking, improve sleep and express emotions appropriately for 5 out of 7 days for 60 days.  Session Goal: challenge anxious thinking  Behavior: Patient noted that he was right that things with his sister boiled over in the home and there was drama. He noted his sister spend some time with her boyfriend after the blow up and things are calm now but he is on edge about how things could go. Patient was oriented x4 (person, place, situation, and time). Patient was casually dressed, and appropriately groomed. Patient was alert, engaged, pleasant, and cooperative.Patient made moderate progress on his goals.  Interventions: Therapist used CBT interventions and Motivation interviewing to assist patient in coping with anxious thinking related to coming out as trans.   Response: Patient noted that he has been viewing things on [facility] and he feels like now is a dangerous time to come out as trans but also the best time. He felt like it would be a dangerous time because of societal, legal, and political changes that he felt were against trans people. He also felt like it was the best time because doing something that is difficult feels satisfying. Patient fears that his father may be the one who may make a direct or indirect negative comment. He feels like his mother and brother would be supportive. Patient couldn't identify what he expects with his family as far as support. Patient's confidence is being trans has increased but he still has worries about the response of others. Patient admitted the worries with never go away but may lessen. He feels like when the pain of staying the same outweighs the pain of changing he will come out.   Patient  engaged in session. He responded well to interventions. Patient continues to meet criteria for Major depressive disorder, recurrent episode, moderate with anxious distress (HCC)  GAD (generalized anxiety disorder) . Patient will continue in outpatient therapy due to being the least restrictive service to meet his needs.   Plan: Return again in 2-4 weeks.   Suicidal/Homicidal: Nowithout intent/plan  Collaboration of Care: Other sources will be identified.       08/25/2023    1:10 PM 07/14/2023    1:14 PM 06/02/2023    1:09 PM  Depression screen PHQ 2/9  Decreased Interest 2 2 2   Down, Depressed, Hopeless 2 2 2   PHQ - 2 Score 4 4 4   Altered sleeping 2 2 2   Tired, decreased energy 3 2 2   Change in appetite 1 1 1   Feeling bad or failure about yourself  2 2 2   Trouble concentrating 1 1 1   Moving slowly or fidgety/restless 0 1 0  Suicidal thoughts 1 1 1   PHQ-9 Score 14 14 13        08/25/2023    1:10 PM 07/14/2023    1:14 PM 06/02/2023    1:09 PM 04/26/2023    1:11 PM  GAD 7 : Generalized Anxiety Score  Nervous, Anxious, on Edge 1 1 2 2   Control/stop worrying 2 1 1 1   Worry too much - different things 2 2 2 2   Trouble relaxing 1 1 2 2   Restless 1 1 1 1   Easily annoyed or irritable 2 2 1 1   Afraid - awful might  happen 1 1 0 0  Total GAD 7 Score 10 9 9 9   Anxiety Difficulty Somewhat difficult Somewhat difficult Somewhat difficult Somewhat difficult    Patient/Guardian was advised Release of Information must be obtained prior to any record release in order to collaborate their care with an outside provider. Patient/Guardian was advised if they have not already done so to contact the registration department to sign all necessary forms in order for us  to release information regarding their care.   Consent: Patient/Guardian gives verbal consent for treatment and assignment of benefits for services provided during this visit. Patient/Guardian expressed understanding and agreed to proceed.    Fonda Conroy, LCSW 11/17/2023

## 2023-11-30 ENCOUNTER — Ambulatory Visit (HOSPITAL_COMMUNITY): Admitting: Licensed Clinical Social Worker

## 2023-12-13 ENCOUNTER — Ambulatory Visit (HOSPITAL_COMMUNITY): Admitting: Licensed Clinical Social Worker

## 2023-12-28 ENCOUNTER — Ambulatory Visit (INDEPENDENT_AMBULATORY_CARE_PROVIDER_SITE_OTHER): Admitting: Licensed Clinical Social Worker

## 2023-12-28 DIAGNOSIS — F411 Generalized anxiety disorder: Secondary | ICD-10-CM

## 2023-12-28 DIAGNOSIS — F331 Major depressive disorder, recurrent, moderate: Secondary | ICD-10-CM

## 2023-12-28 NOTE — Progress Notes (Unsigned)
 THERAPIST PROGRESS NOTE  Session Time: 1:04 pm-1:45 pm  Type of Therapy: Individual Therapy  Treatment Goal: Joseph [name] will manage mood and anxiety as evidenced by returning to a previous level of social functioning, increase zest for life, challenge depressive and anxious thinking, improve sleep and express emotions appropriately for 5 out of 7 days for 60 days.  Session Goal: challenge anxious thinking  Behavior: Patient was oriented x4 (person, place, situation, and time). Patient was casually dressed, and appropriately groomed. Patient was alert, engaged, pleasant, and cooperative.Patient made moderate progress on his goals.  Interventions:   Response:   Patient engaged in session. He responded well to interventions. Patient continues to meet criteria for Major depressive disorder, recurrent episode, moderate with anxious distress (HCC)  GAD (generalized anxiety disorder) . Patient will continue in outpatient therapy due to being the least restrictive service to meet his needs.   Plan: Return again in 2-4 weeks.   Suicidal/Homicidal: Nowithout intent/plan  Collaboration of Care: Other sources will be identified.       08/25/2023    1:10 PM 07/14/2023    1:14 PM 06/02/2023    1:09 PM  Depression screen PHQ 2/9  Decreased Interest 2 2 2   Down, Depressed, Hopeless 2 2 2   PHQ - 2 Score 4 4 4   Altered sleeping 2 2 2   Tired, decreased energy 3 2 2   Change in appetite 1 1 1   Feeling bad or failure about yourself  2 2 2   Trouble concentrating 1 1 1   Moving slowly or fidgety/restless 0 1 0  Suicidal thoughts 1 1 1   PHQ-9 Score 14  14  13       Data saved with a previous flowsheet row definition       08/25/2023    1:10 PM 07/14/2023    1:14 PM 06/02/2023    1:09 PM 04/26/2023    1:11 PM  GAD 7 : Generalized Anxiety Score  Nervous, Anxious, on Edge 1 1 2 2   Control/stop worrying 2 1 1 1   Worry too much - different things 2 2 2 2   Trouble relaxing 1 1 2 2   Restless 1 1 1 1    Easily annoyed or irritable 2 2 1 1   Afraid - awful might happen 1 1 0 0  Total GAD 7 Score 10 9 9 9   Anxiety Difficulty Somewhat difficult Somewhat difficult Somewhat difficult Somewhat difficult    Patient/Guardian was advised Release of Information must be obtained prior to any record release in order to collaborate their care with an outside provider. Patient/Guardian was advised if they have not already done so to contact the registration department to sign all necessary forms in order for us  to release information regarding their care.   Consent: Patient/Guardian gives verbal consent for treatment and assignment of benefits for services provided during this visit. Patient/Guardian expressed understanding and agreed to proceed.   Fonda Conroy, LCSW 12/28/2023

## 2024-02-06 ENCOUNTER — Telehealth (HOSPITAL_COMMUNITY): Payer: Self-pay | Admitting: Psychiatry

## 2024-02-06 MED ORDER — VENLAFAXINE HCL ER 75 MG PO CP24: 75.0000 mg | ORAL_CAPSULE | Freq: Every day | ORAL | 0 refills | Status: DC

## 2024-02-06 NOTE — Telephone Encounter (Signed)
 Received fax from patient's pharmacy requesting refill of venlafaxine  XR (EFFEXOR  XR) 75 MG 24 hr capsule .    [id number] [address], [place] - [address] [place] (Ph: [phone])   Last ordered: 10/26/2023 - 90 capsules Last visit: 09/28/2023 Next visit: None scheduled. Left voicemail requesting call back to schedule.

## 2024-02-08 ENCOUNTER — Ambulatory Visit (INDEPENDENT_AMBULATORY_CARE_PROVIDER_SITE_OTHER): Admitting: Licensed Clinical Social Worker

## 2024-02-08 DIAGNOSIS — F411 Generalized anxiety disorder: Secondary | ICD-10-CM | POA: Diagnosis not present

## 2024-02-08 DIAGNOSIS — F331 Major depressive disorder, recurrent, moderate: Secondary | ICD-10-CM

## 2024-02-10 NOTE — Progress Notes (Signed)
 THERAPIST PROGRESS NOTE  Session Time: 1:04 pm-1:45 pm  Type of Therapy: Individual Therapy  Treatment Goal: Juvenal will manage mood and anxiety as evidenced by returning to a previous level of social functioning, increase zest for life, challenge depressive and anxious thinking, improve sleep and express emotions appropriately for 5 out of 7 days for 60 days.  Session Goal: Jaymari WILL REDUCE FREQUENCY OF AVOIDANT BEHAVIORS BY 50% AS EVIDENCED BY SELF-REPORT IN THERAPY SESSIONS   Behavior: Patient noted that things have been good but the last few days have been rough. He has been out of his medication but recently got it again. Patient noted that he decided to start the year off right and focus on achieving personal goals this year. Patient was oriented x4 (person, place, situation, and time). Patient was casually dressed, and appropriately groomed. Patient was alert, engaged, pleasant, and cooperative.Patient made moderate progress on his goals.  Interventions: Therapist used SFBT to identify what is going well and how he has done to stabalize mood.   Response: Patient has decided to complete his GED this year. Patient has had this goal for some time. He feels like when he gets it done, and it will be done. It will not be something he has to renew or do again in a few years. Patient has been intentional about spending time with friends online. He continues to do D and D groups. Patinet has also started collaborative writing a story on line with someone. Patient enjoys writing but has been too hesitant to share his work with others. Patient has been concerned with world events but has been consumed with them. Patient has awareness of social/world events but is not letting it weight him down.   Patient engaged in session. He responded well to interventions. Patient continues to meet criteria for Major depressive disorder, recurrent episode, moderate with anxious distress (HCC)  GAD (generalized  anxiety disorder) . Patient will continue in outpatient therapy due to being the least restrictive service to meet his needs.   Plan: Return will be transferred to an in person therapist in [place], [place].   Suicidal/Homicidal: Nowithout intent/plan  Collaboration of Care: Other sources will be identified.       08/25/2023    1:10 PM 07/14/2023    1:14 PM 06/02/2023    1:09 PM  Depression screen PHQ 2/9  Decreased Interest 2 2 2   Down, Depressed, Hopeless 2 2 2   PHQ - 2 Score 4 4 4   Altered sleeping 2 2 2   Tired, decreased energy 3 2 2   Change in appetite 1 1 1   Feeling bad or failure about yourself  2 2 2   Trouble concentrating 1 1 1   Moving slowly or fidgety/restless 0 1 0  Suicidal thoughts 1 1 1   PHQ-9 Score 14  14  13       Data saved with a previous flowsheet row definition       08/25/2023    1:10 PM 07/14/2023    1:14 PM 06/02/2023    1:09 PM 04/26/2023    1:11 PM  GAD 7 : Generalized Anxiety Score  Nervous, Anxious, on Edge 1 1 2 2   Control/stop worrying 2 1 1 1   Worry too much - different things 2 2 2 2   Trouble relaxing 1 1 2 2   Restless 1 1 1 1   Easily annoyed or irritable 2 2 1 1   Afraid - awful might happen 1 1 0 0  Total GAD 7 Score 10 9  9 9  Anxiety Difficulty Somewhat difficult Somewhat difficult Somewhat difficult Somewhat difficult    Patient/Guardian was advised Release of Information must be obtained prior to any record release in order to collaborate their care with an outside provider. Patient/Guardian was advised if they have not already done so to contact the registration department to sign all necessary forms in order for us  to release information regarding their care.   Consent: Patient/Guardian gives verbal consent for treatment and assignment of benefits for services provided during this visit. Patient/Guardian expressed understanding and agreed to proceed.   Fonda Conroy, LCSW 02/10/2024

## 2024-02-15 ENCOUNTER — Telehealth (HOSPITAL_COMMUNITY): Admitting: Psychiatry

## 2024-02-15 ENCOUNTER — Encounter (HOSPITAL_COMMUNITY): Payer: Self-pay

## 2024-02-20 ENCOUNTER — Telehealth (HOSPITAL_COMMUNITY): Admitting: Psychiatry

## 2024-02-20 ENCOUNTER — Encounter (HOSPITAL_COMMUNITY): Payer: Self-pay | Admitting: Psychiatry

## 2024-02-20 DIAGNOSIS — F331 Major depressive disorder, recurrent, moderate: Secondary | ICD-10-CM

## 2024-02-20 DIAGNOSIS — F4010 Social phobia, unspecified: Secondary | ICD-10-CM

## 2024-02-20 DIAGNOSIS — F411 Generalized anxiety disorder: Secondary | ICD-10-CM | POA: Diagnosis not present

## 2024-02-20 MED ORDER — VENLAFAXINE HCL ER 75 MG PO CP24: 75.0000 mg | ORAL_CAPSULE | Freq: Every day | ORAL | 0 refills | Status: AC

## 2024-02-20 NOTE — Progress Notes (Signed)
 " [facility] Follow up visit  Patient Identification: Joseph [name] MRN:  [id number] Date of Evaluation:  02/20/2024 Referral Source: Therapist Chief Complaint:   No chief complaint on file. Follow up mood /depression  Visit Diagnosis:    ICD-10-CM   1. Major depressive disorder, recurrent episode, moderate with anxious distress (HCC)  F33.1     2. GAD (generalized anxiety disorder)  F41.1     3. Social anxiety disorder  F40.10     Virtual Visit via Video Note  I connected with Joseph [name] on 02/20/24 at  1:00 PM EST by a video enabled telemedicine application and verified that I am speaking with the correct person using two identifiers.  Location: Patient: home Provider: home office   I discussed the limitations of evaluation and management by telemedicine and the availability of in person appointments. The patient expressed understanding and agreed to proceed.     I discussed the assessment and treatment plan with the patient. The patient was provided an opportunity to ask questions and all were answered. The patient agreed with the plan and demonstrated an understanding of the instructions.   The patient was advised to call back or seek an in-person evaluation if the symptoms worsen or if the condition fails to improve as anticipated.  I provided 20 minutes of non-face-to-face time during this encounter.        History of Present Illness: Patient is a single 19 years old white male lives with his parents plans to complete his GED this year initially  referred by her therapist to establish care with history of depression.    Patient diagnosed with depression, anxiety, social anxiety On evaluation doing stable improving still has some communication concerns and regarding to his personal matters with the parents or of transitioning  Aggravating factors; difficult schooling, how to tell of transitioning to family when ready  Modifying factors: video games, music,  writing  Duration more then 3 years  Hospital admission: denies  Suicide attempt: denies  Past Psychiatric History: depression  Previous Psychotropic Medications: Yes  Prozac didn't help Substance Abuse History in the last 12 months:  No.  Consequences of Substance Abuse: NA  Past Medical History:  Past Medical History:  Diagnosis Date   Class 1 obesity    Seasonal allergies     Past Surgical History:  Procedure Laterality Date   MANDIBLE SURGERY      Family Psychiatric History: mom; bipolar, brother esta: bipolar  Family History:  Family History  Problem Relation Age of Onset   Rheum arthritis Father    Heart attack Father    Heart disease Father    Diabetes Maternal Grandmother     Social History:   Social History   Socioeconomic History   Marital status: Single    Spouse name: Not on file   Number of children: Not on file   Years of education: Not on file   Highest education level: Not on file  Occupational History   Not on file  Tobacco Use   Smoking status: Passive Smoke Exposure - Never Smoker   Smokeless tobacco: Never  Substance and Sexual Activity   Alcohol use: No   Drug use: No   Sexual activity: Not on file  Other Topics Concern   Not on file  Social History Narrative   Not on file   Social Drivers of Health   Tobacco Use: Medium Risk (02/20/2024)   Patient History    Smoking Tobacco Use: Passive Smoke Exposure -  Never Smoker    Smokeless Tobacco Use: Never    Passive Exposure: Yes  Financial Resource Strain: Low Risk (02/10/2023)   [facility] from [facility]   Overall Financial Resource Strain (CARDIA)    Difficulty of Paying Living Expenses: Not very hard  Food Insecurity: No Food Insecurity (02/10/2023)   [facility]   Epic    Within the past 12 months, you worried that your food would run out before you got the money to buy more.: Never true    Within the past 12 months, the food you bought just didn't last  and you didn't have money to get more.: Never true  Transportation Needs: No Transportation Needs (02/10/2023)   Received from [facility] - Transportation    Lack of Transportation (Medical): No    Lack of Transportation (Non-Medical): No  Physical Activity: Unknown (02/10/2023)   Received from [facility]   Exercise Vital Sign    On average, how many days per week do you engage in moderate to strenuous exercise (like a brisk walk)?: 0 days    Minutes of Exercise per Session: Not on file  Stress: Stress Concern Present (02/10/2023)   Received from [facility] [facility] - Occupational Stress Questionnaire    Feeling of Stress : To some extent  Social Connections: Somewhat Isolated (02/10/2023)   Received from [facility]   Social Network    How would you rate your social network (family, work, friends)?: Restricted participation with some degree of social isolation  Depression (PHQ2-9): High Risk (08/25/2023)   Depression (PHQ2-9)    PHQ-2 Score: 14  Alcohol Screen: Not on file  Housing: Low Risk (02/10/2023)   Received from [facility]    In the last 12 months, was there a time when you were not able to pay the mortgage or rent on time?: No    In the past 12 months, how many times have you moved where you were living?: 0    At any time in the past 12 months, were you homeless or living in a shelter (including now)?: No  Utilities: Not At Risk (02/10/2023)   Received from [facility] Utilities    Threatened with loss of utilities: No  Health Literacy: Not on file     Allergies:  No Known Allergies  Metabolic Disorder Labs: No results found for: HGBA1C, MPG No results found for: PROLACTIN No results found for: CHOL, TRIG, HDL, CHOLHDL, VLDL, LDLCALC No results found for: TSH  Therapeutic Level Labs: No results found for: LITHIUM No results found for: CBMZ No results found for:  VALPROATE  Current Medications: Current Outpatient Medications  Medication Sig Dispense Refill   venlafaxine  XR (EFFEXOR  XR) 75 MG 24 hr capsule Take 1 capsule (75 mg total) by mouth daily with breakfast. 90 capsule 0   No current facility-administered medications for this visit.    Psychiatric Specialty Exam: Review of Systems  Cardiovascular:  Negative for chest pain.  Psychiatric/Behavioral:  Negative for agitation.     There were no vitals taken for this visit.There is no height or weight on file to calculate BMI.  General Appearance: Casual  Eye Contact:  Fair  Speech:  Clear and Coherent  Volume:  Decreased  Mood:  fair  Affect:  Congruent  Thought Process:  Goal Directed  Orientation:  Full (Time, Place, and Person)  Thought Content:  Rumination  Suicidal Thoughts:  No  Homicidal Thoughts:  No  Memory:  Immediate;   Fair  Judgement:  Fair  Insight:  Fair  Psychomotor Activity:  Decreased  Concentration:  Concentration: Fair  Recall:  [employer] of Knowledge:Good  Language: Good  Akathisia:  No  Handed:    AIMS (if indicated):  not done  Assets:  Desire for Improvement  ADL's:  Intact  Cognition: WNL  Sleep:  irregular   Screenings: GAD-7    Flowsheet Row Counselor from 08/25/2023 in [facility] at [facility] Counselor from 07/14/2023 in [facility] at [name] Counselor from 06/02/2023 in [facility] Outpatient Behavioral Health at [facility] Counselor from 04/26/2023 in [facility] Outpatient Behavioral Health at [facility] Counselor from 04/13/2023 in [facility] at [facility]  Total GAD-7 Score 10 9 9 9 8    PHQ2-9    Flowsheet Row Counselor from 08/25/2023 in [facility] at [facility] Counselor from 07/14/2023 in [facility] Outpatient Behavioral [facility] at [facility] Counselor from 06/02/2023 in [facility] at [facility] Counselor from 04/26/2023 in [facility] Outpatient Behavioral Health at [facility] Counselor from 04/13/2023 in [facility] Outpatient Behavioral Health at [facility]  PHQ-2 Total Score 4 4 4 4 4   PHQ-9 Total Score 14 14 13 11 11    Flowsheet Row Video Visit from 06/29/2023 in [facility] Outpatient Behavioral Health at [facility] Video Visit from 04/20/2023 in [facility] at [facility] Counselor from 06/25/2021 in [facility] at [facility]  C-SSRS RISK CATEGORY No Risk No Risk Error: Q7 should not be populated when Q6 is No    Assessment and Plan: as folllows Prior documentation reviewed  Major depressive disorder recurrent moderate; manageable continue Effexor    Generalized anxiety disorder; improved and continue work on distraction he will look for the new therapist since job she is leaving  Continue Effexor  at the current dose social anxiety disorder; still working on it and continue therapy, not worse .  Some improvement  Follow-up in 4 to 6 months months or earlier if needed renewed prescription questions addressed Collaboration of Care: Other notes and chart, reviewed of therapist   Patient/Guardian was advised Release of Information must be obtained prior to any record release in order to collaborate their care with an outside provider. Patient/Guardian was advised if they have not already done so to contact the registration department to sign all necessary forms in order for us  to release information regarding their care.   Consent: Patient/Guardian gives verbal consent for treatment and assignment of benefits for services provided during this visit. Patient/Guardian expressed understanding and agreed to proceed.   Jackey Flight, MD 1/26/20261:40 PM  "

## 2024-02-22 ENCOUNTER — Ambulatory Visit (HOSPITAL_COMMUNITY): Admitting: Licensed Clinical Social Worker

## 2024-03-05 ENCOUNTER — Ambulatory Visit (HOSPITAL_COMMUNITY): Admitting: Licensed Clinical Social Worker

## 2024-03-21 ENCOUNTER — Ambulatory Visit (HOSPITAL_COMMUNITY): Admitting: Licensed Clinical Social Worker

## 2024-08-20 ENCOUNTER — Telehealth (HOSPITAL_COMMUNITY): Admitting: Psychiatry
